# Patient Record
Sex: Male | Born: 1955 | Race: White | Hispanic: No | Marital: Married | Smoking: Former smoker
Health system: Southern US, Community
[De-identification: ages and names within clinical notes are randomized; demographics above are authoritative.]

## PROBLEM LIST (undated history)

## (undated) DIAGNOSIS — K219 Gastro-esophageal reflux disease without esophagitis: Secondary | ICD-10-CM

## (undated) DIAGNOSIS — E119 Type 2 diabetes mellitus without complications: Secondary | ICD-10-CM

## (undated) DIAGNOSIS — I10 Essential (primary) hypertension: Secondary | ICD-10-CM

## (undated) DIAGNOSIS — E785 Hyperlipidemia, unspecified: Secondary | ICD-10-CM

## (undated) DIAGNOSIS — I4719 Other supraventricular tachycardia: Secondary | ICD-10-CM

## (undated) DIAGNOSIS — I471 Supraventricular tachycardia: Secondary | ICD-10-CM

## (undated) DIAGNOSIS — K469 Unspecified abdominal hernia without obstruction or gangrene: Secondary | ICD-10-CM

## (undated) HISTORY — DX: Essential (primary) hypertension: I10

## (undated) HISTORY — DX: Supraventricular tachycardia: I47.1

## (undated) HISTORY — DX: Hyperlipidemia, unspecified: E78.5

## (undated) HISTORY — DX: Other supraventricular tachycardia: I47.19

## (undated) HISTORY — DX: Unspecified abdominal hernia without obstruction or gangrene: K46.9

---
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
## 1980-10-15 HISTORY — PX: OTHER SURGICAL HISTORY: SHX169

## 1987-10-16 HISTORY — PX: HERNIA REPAIR: SHX51

## 2006-11-14 ENCOUNTER — Ambulatory Visit (HOSPITAL_COMMUNITY): Admission: RE | Admit: 2006-11-14 | Discharge: 2006-11-14 | Payer: Self-pay | Admitting: Family Medicine

## 2008-03-29 ENCOUNTER — Encounter (HOSPITAL_COMMUNITY): Admission: RE | Admit: 2008-03-29 | Discharge: 2008-03-30 | Payer: Self-pay | Admitting: Family Medicine

## 2009-11-26 ENCOUNTER — Emergency Department (HOSPITAL_COMMUNITY): Admission: EM | Admit: 2009-11-26 | Discharge: 2009-11-26 | Payer: Self-pay | Admitting: Family Medicine

## 2010-12-04 ENCOUNTER — Other Ambulatory Visit: Payer: Self-pay | Admitting: Surgery

## 2010-12-04 ENCOUNTER — Encounter (HOSPITAL_COMMUNITY): Payer: PRIVATE HEALTH INSURANCE

## 2010-12-04 ENCOUNTER — Encounter (HOSPITAL_COMMUNITY)
Admission: RE | Admit: 2010-12-04 | Discharge: 2010-12-04 | Disposition: A | Discharge status: Home / Self Care | Payer: PRIVATE HEALTH INSURANCE | Source: Ambulatory Visit | Attending: Surgery | Admitting: Surgery

## 2010-12-04 ENCOUNTER — Other Ambulatory Visit (HOSPITAL_COMMUNITY): Payer: Self-pay | Admitting: Surgery

## 2010-12-04 DIAGNOSIS — K469 Unspecified abdominal hernia without obstruction or gangrene: Secondary | ICD-10-CM

## 2010-12-04 DIAGNOSIS — Z01812 Encounter for preprocedural laboratory examination: Secondary | ICD-10-CM | POA: Insufficient documentation

## 2010-12-04 DIAGNOSIS — Z01810 Encounter for preprocedural cardiovascular examination: Secondary | ICD-10-CM | POA: Insufficient documentation

## 2010-12-04 DIAGNOSIS — Z01818 Encounter for other preprocedural examination: Secondary | ICD-10-CM | POA: Insufficient documentation

## 2010-12-04 LAB — CBC
HCT: 42.4 % (ref 39.0–52.0)
Hemoglobin: 14 g/dL (ref 13.0–17.0)
MCHC: 33 g/dL (ref 30.0–36.0)
MCV: 88.7 fL (ref 78.0–100.0)
WBC: 5.2 10*3/uL (ref 4.0–10.5)

## 2010-12-04 LAB — COMPREHENSIVE METABOLIC PANEL
ALT: 28 U/L (ref 0–53)
Alkaline Phosphatase: 51 U/L (ref 39–117)
CO2: 28 mEq/L (ref 19–32)
Glucose, Bld: 107 mg/dL — ABNORMAL HIGH (ref 70–99)
Potassium: 3.8 mEq/L (ref 3.5–5.1)
Sodium: 139 mEq/L (ref 135–145)
Total Bilirubin: 0.5 mg/dL (ref 0.3–1.2)

## 2010-12-04 LAB — DIFFERENTIAL
Basophils Absolute: 0.1 10*3/uL (ref 0.0–0.1)
Lymphocytes Relative: 46 % (ref 12–46)
Lymphs Abs: 2.4 10*3/uL (ref 0.7–4.0)
Monocytes Absolute: 0.7 10*3/uL (ref 0.1–1.0)
Neutro Abs: 1.7 10*3/uL (ref 1.7–7.7)

## 2010-12-04 LAB — SURGICAL PCR SCREEN: MRSA, PCR: NEGATIVE

## 2010-12-05 ENCOUNTER — Ambulatory Visit (HOSPITAL_COMMUNITY)
Admission: RE | Admit: 2010-12-05 | Discharge: 2010-12-05 | Disposition: A | Discharge status: Home / Self Care | Payer: PRIVATE HEALTH INSURANCE | Source: Ambulatory Visit | Attending: Surgery | Admitting: Surgery

## 2010-12-05 DIAGNOSIS — I471 Supraventricular tachycardia, unspecified: Secondary | ICD-10-CM | POA: Insufficient documentation

## 2010-12-05 DIAGNOSIS — Z79899 Other long term (current) drug therapy: Secondary | ICD-10-CM | POA: Insufficient documentation

## 2010-12-05 DIAGNOSIS — K429 Umbilical hernia without obstruction or gangrene: Secondary | ICD-10-CM | POA: Insufficient documentation

## 2010-12-05 DIAGNOSIS — K4090 Unilateral inguinal hernia, without obstruction or gangrene, not specified as recurrent: Secondary | ICD-10-CM | POA: Insufficient documentation

## 2010-12-05 DIAGNOSIS — I10 Essential (primary) hypertension: Secondary | ICD-10-CM | POA: Insufficient documentation

## 2010-12-12 NOTE — Op Note (Signed)
  [name], Travis Hamilton               ACCOUNT NO.:  [id number]  MEDICAL RECORD NO.:  [id number]           PATIENT TYPE:  O  LOCATION:  XRAY                         FACILITY:  [facility]  PHYSICIAN:  Llewellyn Schoenberger A. Yavonne Kiss, M.D.DATE OF BIRTH:  [birth year]  DATE OF PROCEDURE:  12/05/2010 DATE OF DISCHARGE:  12/04/2010                              OPERATIVE REPORT   PREOPERATIVE DIAGNOSIS:  Umbilical hernia.  POSTOPERATIVE DIAGNOSIS:  Umbilical hernia.  PROCEDURE:  Repair of 2 cm umbilical hernia with PROCEED mesh.  SURGEON:  Maisie Fus A. Chistopher Mangino, M.D.  ANESTHESIA:  General endotracheal anesthesia with 0.25% Sensorcaine local.  ESTIMATED BLOOD LOSS:  Minimal.  SPECIMEN:  None.  INDICATIONS FOR PROCEDURE:  The patient is a 55 year old male with a symptomatic umbilical hernia that is getting larger.  We discussed the options of repair versus observation.  He wished to proceed with repair after discussion of the procedure, the risks, potential long-term benefits and quality of life issues.  DESCRIPTION OF PROCEDURE:  The patient was brought to the operating room and placed supine.  The abdomen was then prepped and draped in the sterile fashion after induction of general anesthesia without difficulty.  Time-out was done.  After prep and drape, a curvilinear incision was made along the inferior aspect of the umbilicus.  The skin was elevated off the hernia sac.  The hernia sac was dissected off the undersurface of the skin.  The hernia defect was identified.  It was grasped with Kochers.  It was about 2 cm.  I opened the hernia sac and there were no contents within it.  I went ahead and closed the hernia sac with 3-0 Vicryl.  I then created a space in the preperitoneal space using my finger bluntly for about 2 to 3 cm circumferentially.  A 4.3 x 4.3 cm PROCEED umbilical mesh was brought onto the field.  It was placed in the preperitoneal space.  The straps were pulled up on the mesh to help  seed it under the undersurface of the abdominal wall.  It was secured in place with six sutures of #1 Novafil pop-off.  We then closed the common fascial defect with #1 Novafil pop-off.  We then sutured the undersurface of the umbilicus down to the fascia.  We closed the skin with 4-0 Monocryl.  Dermabond was applied.  All final counts of sponge, needle and instruments were found to be correct at this portion of the patient's case.  The patient was awoken and taken to recovery in satisfactory condition.     Vedder Brittian A. Zethan Alfieri, M.D.     TAC/MEDQ  D:  12/05/2010  T:  12/05/2010  Job:  [id number]  cc:   Bryan Lemma. Manus Gunning, M.D. Fax: [phone]  Electronically Signed by Harriette Bouillon M.D. on 12/12/2010 08:45:17 AM

## 2011-03-22 ENCOUNTER — Encounter (INDEPENDENT_AMBULATORY_CARE_PROVIDER_SITE_OTHER): Payer: Self-pay | Admitting: Surgery

## 2013-12-16 ENCOUNTER — Ambulatory Visit: Payer: PRIVATE HEALTH INSURANCE | Admitting: Dietician

## 2014-01-27 ENCOUNTER — Ambulatory Visit: Payer: PRIVATE HEALTH INSURANCE | Admitting: *Deleted

## 2014-12-16 ENCOUNTER — Ambulatory Visit: Payer: PRIVATE HEALTH INSURANCE

## 2014-12-23 ENCOUNTER — Ambulatory Visit: Payer: PRIVATE HEALTH INSURANCE

## 2014-12-30 ENCOUNTER — Ambulatory Visit: Payer: PRIVATE HEALTH INSURANCE

## 2015-10-19 ENCOUNTER — Encounter (HOSPITAL_COMMUNITY): Payer: Self-pay | Admitting: *Deleted

## 2015-10-19 ENCOUNTER — Emergency Department (HOSPITAL_COMMUNITY): Payer: PRIVATE HEALTH INSURANCE

## 2015-10-19 ENCOUNTER — Emergency Department (HOSPITAL_COMMUNITY)
Admission: EM | Admit: 2015-10-19 | Discharge: 2015-10-19 | Disposition: A | Discharge status: Home / Self Care | Payer: PRIVATE HEALTH INSURANCE | Attending: Emergency Medicine | Admitting: Emergency Medicine

## 2015-10-19 DIAGNOSIS — Z87891 Personal history of nicotine dependence: Secondary | ICD-10-CM | POA: Diagnosis not present

## 2015-10-19 DIAGNOSIS — Z8719 Personal history of other diseases of the digestive system: Secondary | ICD-10-CM | POA: Insufficient documentation

## 2015-10-19 DIAGNOSIS — R000 Tachycardia, unspecified: Secondary | ICD-10-CM | POA: Diagnosis not present

## 2015-10-19 DIAGNOSIS — Z79899 Other long term (current) drug therapy: Secondary | ICD-10-CM | POA: Diagnosis not present

## 2015-10-19 DIAGNOSIS — E785 Hyperlipidemia, unspecified: Secondary | ICD-10-CM | POA: Insufficient documentation

## 2015-10-19 DIAGNOSIS — Z791 Long term (current) use of non-steroidal anti-inflammatories (NSAID): Secondary | ICD-10-CM | POA: Diagnosis not present

## 2015-10-19 DIAGNOSIS — R002 Palpitations: Secondary | ICD-10-CM | POA: Diagnosis present

## 2015-10-19 DIAGNOSIS — R079 Chest pain, unspecified: Secondary | ICD-10-CM | POA: Insufficient documentation

## 2015-10-19 DIAGNOSIS — I10 Essential (primary) hypertension: Secondary | ICD-10-CM | POA: Insufficient documentation

## 2015-10-19 LAB — CBC WITH DIFFERENTIAL/PLATELET
BASOS ABS: 0 10*3/uL (ref 0.0–0.1)
Basophils Relative: 0 %
Eosinophils Absolute: 0 10*3/uL (ref 0.0–0.7)
Eosinophils Relative: 0 %
HEMATOCRIT: 42.8 % (ref 39.0–52.0)
Hemoglobin: 14.4 g/dL (ref 13.0–17.0)
LYMPHS PCT: 18 %
Lymphs Abs: 1.6 10*3/uL (ref 0.7–4.0)
MCH: 28.8 pg (ref 26.0–34.0)
MCHC: 33.6 g/dL (ref 30.0–36.0)
MCV: 85.6 fL (ref 78.0–100.0)
Monocytes Absolute: 0.1 10*3/uL (ref 0.1–1.0)
Monocytes Relative: 2 %
NEUTROS ABS: 7.2 10*3/uL (ref 1.7–7.7)
Neutrophils Relative %: 80 %
PLATELETS: 268 10*3/uL (ref 150–400)
RBC: 5 MIL/uL (ref 4.22–5.81)
RDW: 12.7 % (ref 11.5–15.5)
WBC: 8.8 10*3/uL (ref 4.0–10.5)

## 2015-10-19 LAB — COMPREHENSIVE METABOLIC PANEL
ALT: 25 U/L (ref 17–63)
AST: 25 U/L (ref 15–41)
Albumin: 3.6 g/dL (ref 3.5–5.0)
Alkaline Phosphatase: 48 U/L (ref 38–126)
Anion gap: 11 (ref 5–15)
BILIRUBIN TOTAL: 0.6 mg/dL (ref 0.3–1.2)
BUN: 14 mg/dL (ref 6–20)
CHLORIDE: 103 mmol/L (ref 101–111)
CO2: 22 mmol/L (ref 22–32)
CREATININE: 1.29 mg/dL — AB (ref 0.61–1.24)
Calcium: 9.8 mg/dL (ref 8.9–10.3)
GFR calc Af Amer: >60 mL/min (ref >60)
GFR, EST NON AFRICAN AMERICAN: 58 mL/min — AB (ref >60)
GLUCOSE: 326 mg/dL — AB (ref 65–99)
Potassium: 4.2 mmol/L (ref 3.5–5.1)
Sodium: 136 mmol/L (ref 135–145)
Total Protein: 7.3 g/dL (ref 6.5–8.1)

## 2015-10-19 LAB — I-STAT TROPONIN, ED: TROPONIN I, POC: 0.02 ng/mL (ref 0.00–0.08)

## 2015-10-19 MED ORDER — LORAZEPAM 2 MG/ML IJ SOLN
0.5000 mg | Freq: Once | INTRAMUSCULAR | Status: AC
  Administered 2015-10-19: 0.5 mg via INTRAVENOUS
  Filled 2015-10-19: qty 1

## 2015-10-19 MED ORDER — IOHEXOL 350 MG/ML SOLN
60.0000 mL | Freq: Once | INTRAVENOUS | Status: AC | PRN
  Administered 2015-10-19: 60 mL via INTRAVENOUS

## 2015-10-19 MED ORDER — SODIUM CHLORIDE 0.9 % IV BOLUS (SEPSIS)
1000.0000 mL | Freq: Once | INTRAVENOUS | Status: AC
  Administered 2015-10-19: 1000 mL via INTRAVENOUS

## 2015-10-19 NOTE — ED Notes (Signed)
Dr. Floyd at bedside. 

## 2015-10-19 NOTE — Discharge Instructions (Signed)
Nonspecific Tachycardia Tachycardia is a faster than normal heartbeat (more than 100 beats per minute). In adults, the heart normally beats between 60 and 100 times a minute. A fast heartbeat may be a normal response to exercise or stress. It does not necessarily mean that something is wrong. However, sometimes when your heart beats too fast it may not be able to pump enough blood to the rest of your body. This can result in chest pain, shortness of breath, dizziness, and even fainting. Nonspecific tachycardia means that the specific cause or pattern of your tachycardia is unknown. CAUSES  Tachycardia may be harmless or it may be due to a more serious underlying cause. Possible causes of tachycardia include:  Exercise or exertion.  Fever.  Pain or injury.  Infection.  Loss of body fluids (dehydration).  Overactive thyroid.  Lack of red blood cells (anemia).  Anxiety and stress.  Alcohol.  Caffeine.  Tobacco products.  Diet pills.  Illegal drugs.  Heart disease. SYMPTOMS  Rapid or irregular heartbeat (palpitations).  Suddenly feeling your heart beating (cardiac awareness).  Dizziness.  Tiredness (fatigue).  Shortness of breath.  Chest pain.  Nausea.  Fainting. DIAGNOSIS  Your caregiver will perform a physical exam and take your medical history. In some cases, a heart specialist (cardiologist) may be consulted. Your caregiver may also order:  Blood tests.  Electrocardiography. This test records the electrical activity of your heart.  A heart monitoring test. TREATMENT  Treatment will depend on the likely cause of your tachycardia. The goal is to treat the underlying cause of your tachycardia. Treatment methods may include:  Replacement of fluids or blood through an intravenous (IV) tube for moderate to severe dehydration or anemia.  New medicines or changes in your current medicines.  Diet and lifestyle changes.  Treatment for certain  infections.  Stress relief or relaxation methods. HOME CARE INSTRUCTIONS   Rest.  Drink enough fluids to keep your urine clear or pale yellow.  Do not smoke.  Avoid:  Caffeine.  Tobacco.  Alcohol.  Chocolate.  Stimulants such as over-the-counter diet pills or pills that help you stay awake.  Situations that cause anxiety or stress.  Illegal drugs such as marijuana, phencyclidine (PCP), and cocaine.  Only take medicine as directed by your caregiver.  Keep all follow-up appointments as directed by your caregiver. SEEK IMMEDIATE MEDICAL CARE IF:   You have pain in your chest, upper arms, jaw, or neck.  You become weak, dizzy, or feel faint.  You have palpitations that will not go away.  You vomit, have diarrhea, or pass blood in your stool.  Your skin is cool, pale, and wet.  You have a fever that will not go away with rest, fluids, and medicine. MAKE SURE YOU:   Understand these instructions.  Will watch your condition.  Will get help right away if you are not doing well or get worse.   This information is not intended to replace advice given to you by your health care provider. Make sure you discuss any questions you have with your health care provider.   Document Released: 11/08/2004 Document Revised: 12/24/2011 Document Reviewed: 04/15/2015 Elsevier Interactive Patient Education 2016 [employer]  

## 2015-10-19 NOTE — ED Notes (Signed)
Seen today at PMD and dx with bronchitis and was given a breathing treat and a shot (thinks it may have been a steriod).  Tonight he started with feeling like his heart was beating out of his chest and it just continued to get worse

## 2015-10-19 NOTE — ED Provider Notes (Signed)
CSN: [id number]     Arrival date & time 10/19/15  9147 History   First MD Initiated Contact with Patient 10/19/15 0405     Chief Complaint  Patient presents with  . Tachycardia     (Consider location/radiation/quality/duration/timing/severity/associated sxs/prior Treatment) Patient is a 60 y.o. male presenting with palpitations. The history is provided by the patient.  Palpitations Palpitations quality:  Fast Onset quality:  Sudden Duration:  6 hours Timing:  Constant Progression:  Partially resolved Chronicity:  Recurrent Context: anxiety and caffeine   Relieved by:  Nothing Worsened by:  Nothing Ineffective treatments:  None tried Associated symptoms: chest pain   Associated symptoms: no shortness of breath and no vomiting    60 yo M with a chief complaint of palpitations. Patient was seen today by his family doctor for an upper respiratory illness. They told him that he probably had pneumonia/bronchitis gave him a breathing treatment in the office, as well as a IM steroid injection. Patient said about 2 hours later he had palpitations. Mild chest pressure with this. Worse on exertion, denies difficulty breathing diaphoresis nausea. Patient has a history of paroxysmal atrial tachycardia. Has not had any episodes of this over the past 20 years or so. Patient is a heavy caffeine user. Denied any other new medications. Denies illegal drug use.   Past Medical History  Diagnosis Date  . Hypertension   . PAT (paroxysmal atrial tachycardia) (HCC)   . Hernia   . Hyperlipidemia    Past Surgical History  Procedure Laterality Date  . Tonsillectomy  1960  . Partial disc removal  1982    L-5  . Hernia repair  1989    LOWER LEFT SIDE   Family History  Problem Relation Age of Onset  . Arthritis Sister    Social History  Substance Use Topics  . Smoking status: Former Smoker    Quit date: 02/13/2007  . Smokeless tobacco: Never Used  . Alcohol Use: Yes     Comment: ocassionally     Review of Systems  Constitutional: Negative for fever and chills.  HENT: Negative for congestion and facial swelling.   Eyes: Negative for discharge and visual disturbance.  Respiratory: Negative for shortness of breath.   Cardiovascular: Positive for chest pain and palpitations.  Gastrointestinal: Negative for vomiting, abdominal pain and diarrhea.  Musculoskeletal: Negative for myalgias and arthralgias.  Skin: Negative for color change and rash.  Neurological: Negative for tremors, syncope and headaches.  Psychiatric/Behavioral: Negative for confusion and dysphoric mood.      Allergies  Review of patient's allergies indicates no known allergies.  Home Medications   Prior to Admission medications   Medication Sig Start Date End Date Taking? Authorizing Provider  aspirin-acetaminophen-caffeine (EXCEDRIN MIGRAINE) [phone] MG tablet Take 1 tablet by mouth daily with breakfast.   Yes Historical Provider, MD  lisinopril (PRINIVIL,ZESTRIL) 20 MG tablet Take 20 mg by mouth daily.     Yes Historical Provider, MD  lovastatin (MEVACOR) 40 MG tablet Take 40 mg by mouth at bedtime.     Yes Historical Provider, MD  naproxen sodium (ANAPROX) 220 MG tablet Take 220 mg by mouth 2 (two) times daily with a meal.     Yes Historical Provider, MD   BP 118/66 mmHg  Pulse 117  Temp(Src) 98.6 F (37 C) (Oral)  Resp 23  Ht 5\' 8"  (1.727 m)  Wt 223 lb (101.152 kg)  BMI 33.91 kg/m2  SpO2 94% Physical Exam  Constitutional: He is oriented to person,  place, and time. He appears well-developed and well-nourished.  HENT:  Head: Normocephalic and atraumatic.  Eyes: EOM are normal. Pupils are equal, round, and reactive to light.  Neck: Normal range of motion. Neck supple. No JVD present.  Cardiovascular: Regular rhythm.  Tachycardia present.  Exam reveals no gallop and no friction rub.   No murmur heard. Pulmonary/Chest: No respiratory distress. He has no wheezes.  Abdominal: He exhibits no  distension. There is no tenderness. There is no rebound and no guarding.  Musculoskeletal: Normal range of motion.  Neurological: He is alert and oriented to person, place, and time.  Skin: No rash noted. No pallor.  Psychiatric: He has a normal mood and affect. His behavior is normal.  Nursing note and vitals reviewed.   ED Course  Procedures (including critical care time) Labs Review Labs Reviewed  COMPREHENSIVE METABOLIC PANEL - Abnormal; Notable for the following:    Glucose, Bld 326 (*)    Creatinine, Ser 1.29 (*)    GFR calc non Af Amer 58 (*)    All other components within normal limits  CBC WITH DIFFERENTIAL/PLATELET  Rosezena Sensor, ED    Imaging Review Dg Chest 2 View  10/19/2015  CLINICAL DATA:  Acute onset of shortness of breath, generalized chest pain and palpitations. Initial encounter. EXAM: CHEST  2 VIEW COMPARISON:  Chest radiograph performed 12/04/2010 FINDINGS: The lungs are well-aerated. Mild vascular congestion is noted. There is no evidence of focal opacification, pleural effusion or pneumothorax. The heart is normal in size; the mediastinal contour is within normal limits. No acute osseous abnormalities are seen. IMPRESSION: No acute cardiopulmonary process seen. Mild vascular congestion noted. Electronically Signed   By: Roanna Raider M.D.   On: 10/19/2015 04:04   Ct Angio Chest Pe W/cm &/or Wo Cm  10/19/2015  CLINICAL DATA:  Acute onset of tachycardia and shortness of breath. Initial encounter. EXAM: CT ANGIOGRAPHY CHEST WITH CONTRAST TECHNIQUE: Multidetector CT imaging of the chest was performed using the standard protocol during bolus administration of intravenous contrast. Multiplanar CT image reconstructions and MIPs were obtained to evaluate the vascular anatomy. CONTRAST:  60mL OMNIPAQUE IOHEXOL 350 MG/ML SOLN COMPARISON:  Chest radiograph performed earlier today at 3:48 a.m. FINDINGS: There is no evidence of pulmonary embolus. Vague lucency at the lung  apices is thought to reflect very mild emphysematous change. There is no evidence of significant focal consolidation, pleural effusion or pneumothorax. No masses are identified; no abnormal focal contrast enhancement is seen. The mediastinum is unremarkable in appearance. No mediastinal lymphadenopathy is seen. No pericardial effusion is identified. The great vessels are grossly unremarkable in appearance. No axillary lymphadenopathy is seen. The thyroid gland is unremarkable in appearance. The visualized portions of the liver and spleen are unremarkable. The gallbladder is contracted and grossly unremarkable. The visualized portions of the pancreas, adrenal glands and kidneys are grossly unremarkable. No acute osseous abnormalities are seen. Review of the MIP images confirms the above findings. IMPRESSION: 1. No evidence of pulmonary embolus. 2. Vague lucency at the lung apices is thought to reflect very mild emphysematous change. Lungs otherwise clear. Electronically Signed   By: Roanna Raider M.D.   On: 10/19/2015 06:21   I have personally reviewed and evaluated these images and lab results as part of my medical decision-making.   EKG Interpretation   Date/Time:  Wednesday October 19 2015 03:45:57 EST Ventricular Rate:  133 PR Interval:  130 QRS Duration: 78 QT Interval:  318 QTC Calculation: 473 R Axis:  92 Text Interpretation:  Sinus tachycardia Rightward axis Nonspecific ST  abnormality Abnormal ECG Otherwise no significant change Confirmed by  Mei Suits MD, DANIEL ([id number]) on 10/19/2015 4:30:40 AM      MDM   Final diagnoses:  Sinus tachycardia (HCC)    60 yo M with a chief complaint of palpitations. Patient initial heart rate was in the 130s sinus tachycardia on EKG. On my initial exam patient's heart rate was in the low 100s but jumped up when he started talking to me. Appeared anxious. Patient was given a liter of fluids and Ativan with very minimal relief. Patient not having any  current symptoms of palpitations. Pulmonary embolism study was negative. Patient continues to be mildly tachycardic. States that his heart rate normally is in the low 100s to 110s. He would like to be discharged at this time. We'll have him follow-up with his family doctor and possibly need cardiology follow-up.  6:53 AM:  I have discussed the diagnosis/risks/treatment options with the patient and family and believe the pt to be eligible for discharge home to follow-up with PCP. We also discussed returning to the ED immediately if new or worsening sx occur. We discussed the sx which are most concerning (e.g., sudden worsening chest pain, sob,  fever, inability to tolerate by mouth) that necessitate immediate return. Medications administered to the patient during their visit and any new prescriptions provided to the patient are listed below.  Medications given during this visit Medications  sodium chloride 0.9 % bolus 1,000 mL (0 mLs Intravenous Stopped 10/19/15 0641)  LORazepam (ATIVAN) injection 0.5 mg (0.5 mg Intravenous Given 10/19/15 0529)  iohexol (OMNIPAQUE) 350 MG/ML injection 60 mL (60 mLs Intravenous Contrast Given 10/19/15 0555)    Discharge Medication List as of 10/19/2015  6:25 AM      The patient appears reasonably screen and/or stabilized for discharge and I doubt any other medical condition or other [facility] requiring further screening, evaluation, or treatment in the ED at this time prior to discharge.       Melene Planan Alvie Fowles, DO 10/19/15 [phone]

## 2017-03-17 IMAGING — DX DG CHEST 2V
2 series · 2 of 2 positions shown · non-contrast
Comparison: Chest radiograph performed 12/04/2010

CLINICAL DATA: Acute onset of shortness of breath, generalized
chest pain and palpitations. Initial encounter.

EXAM:
CHEST  2 VIEW

[chest pa]
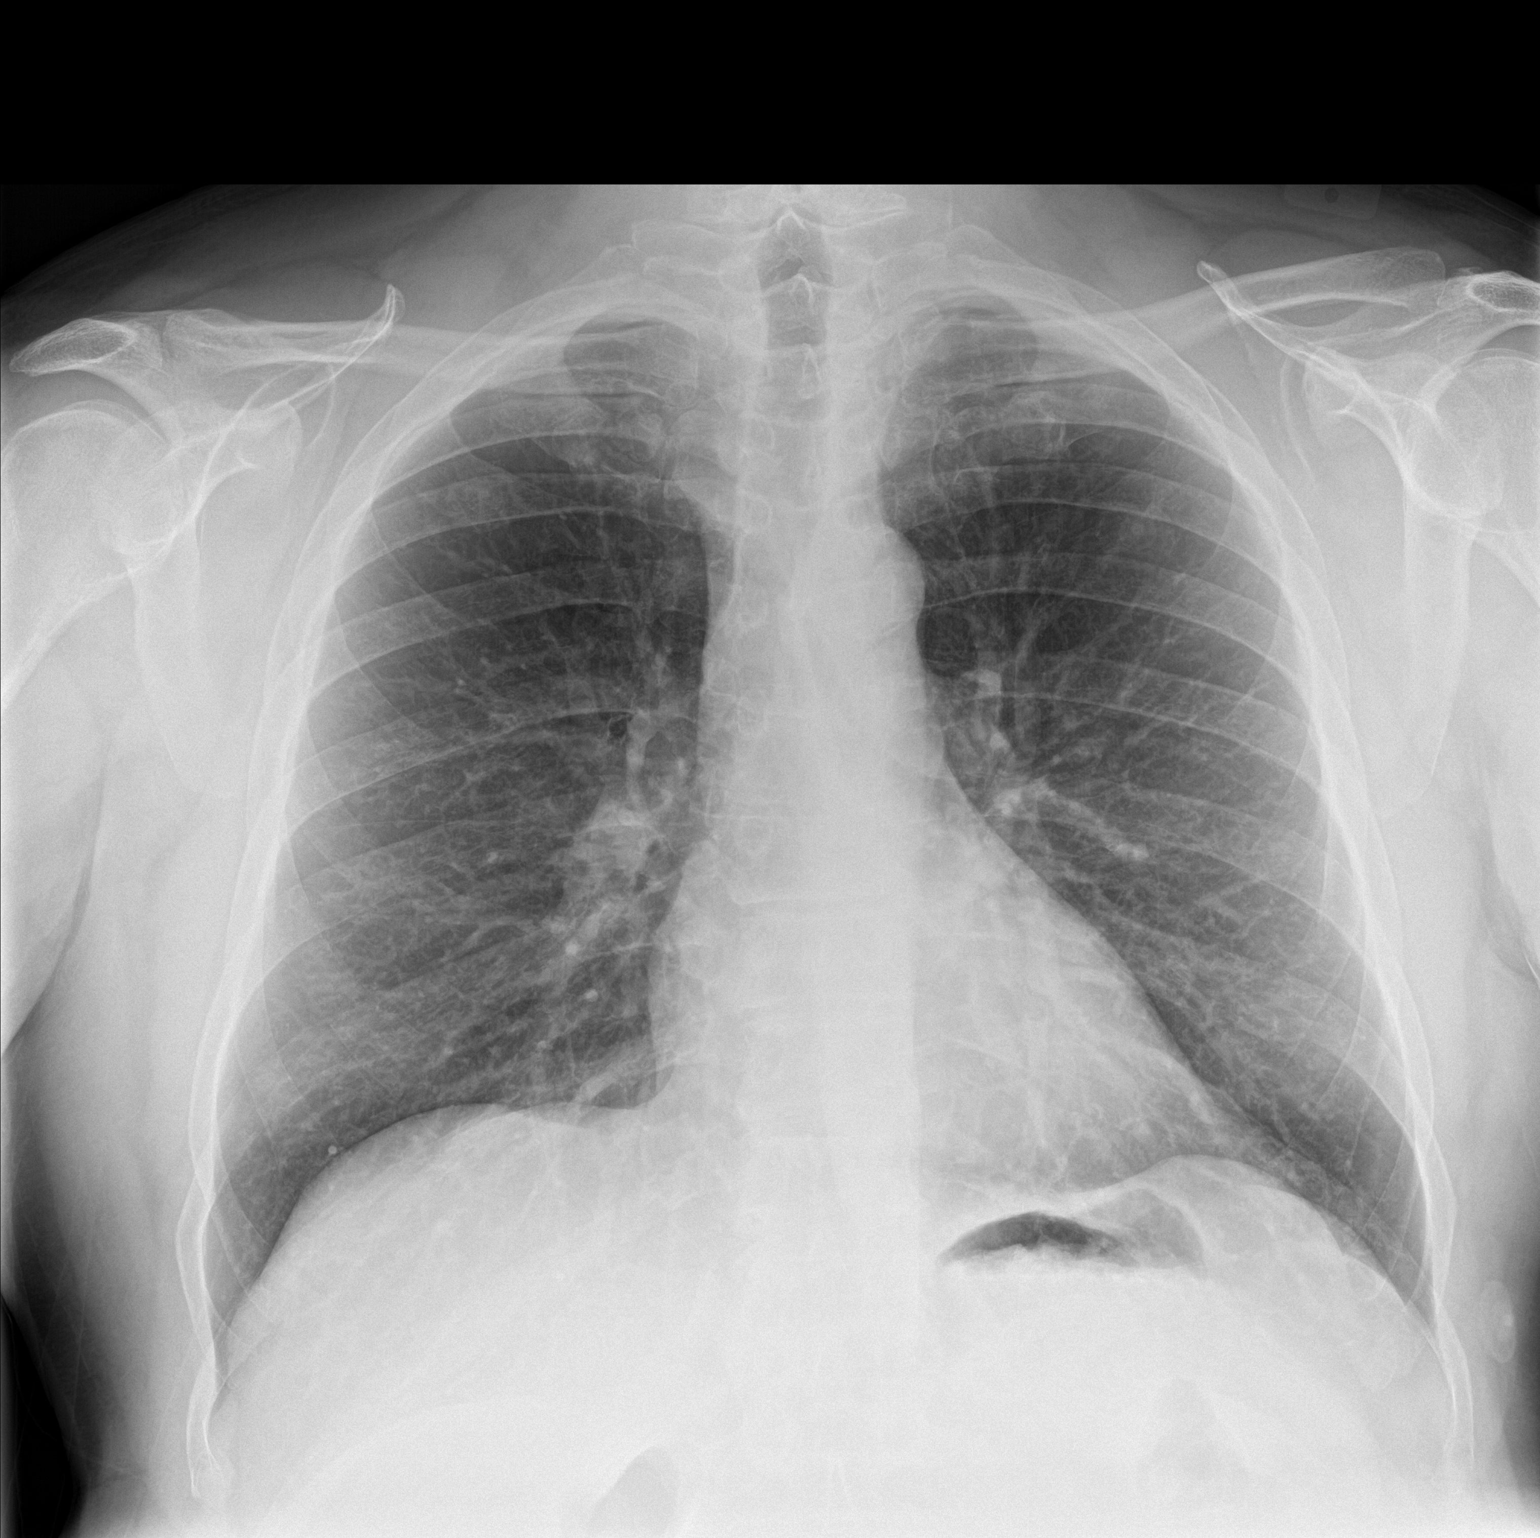

[chest lat]
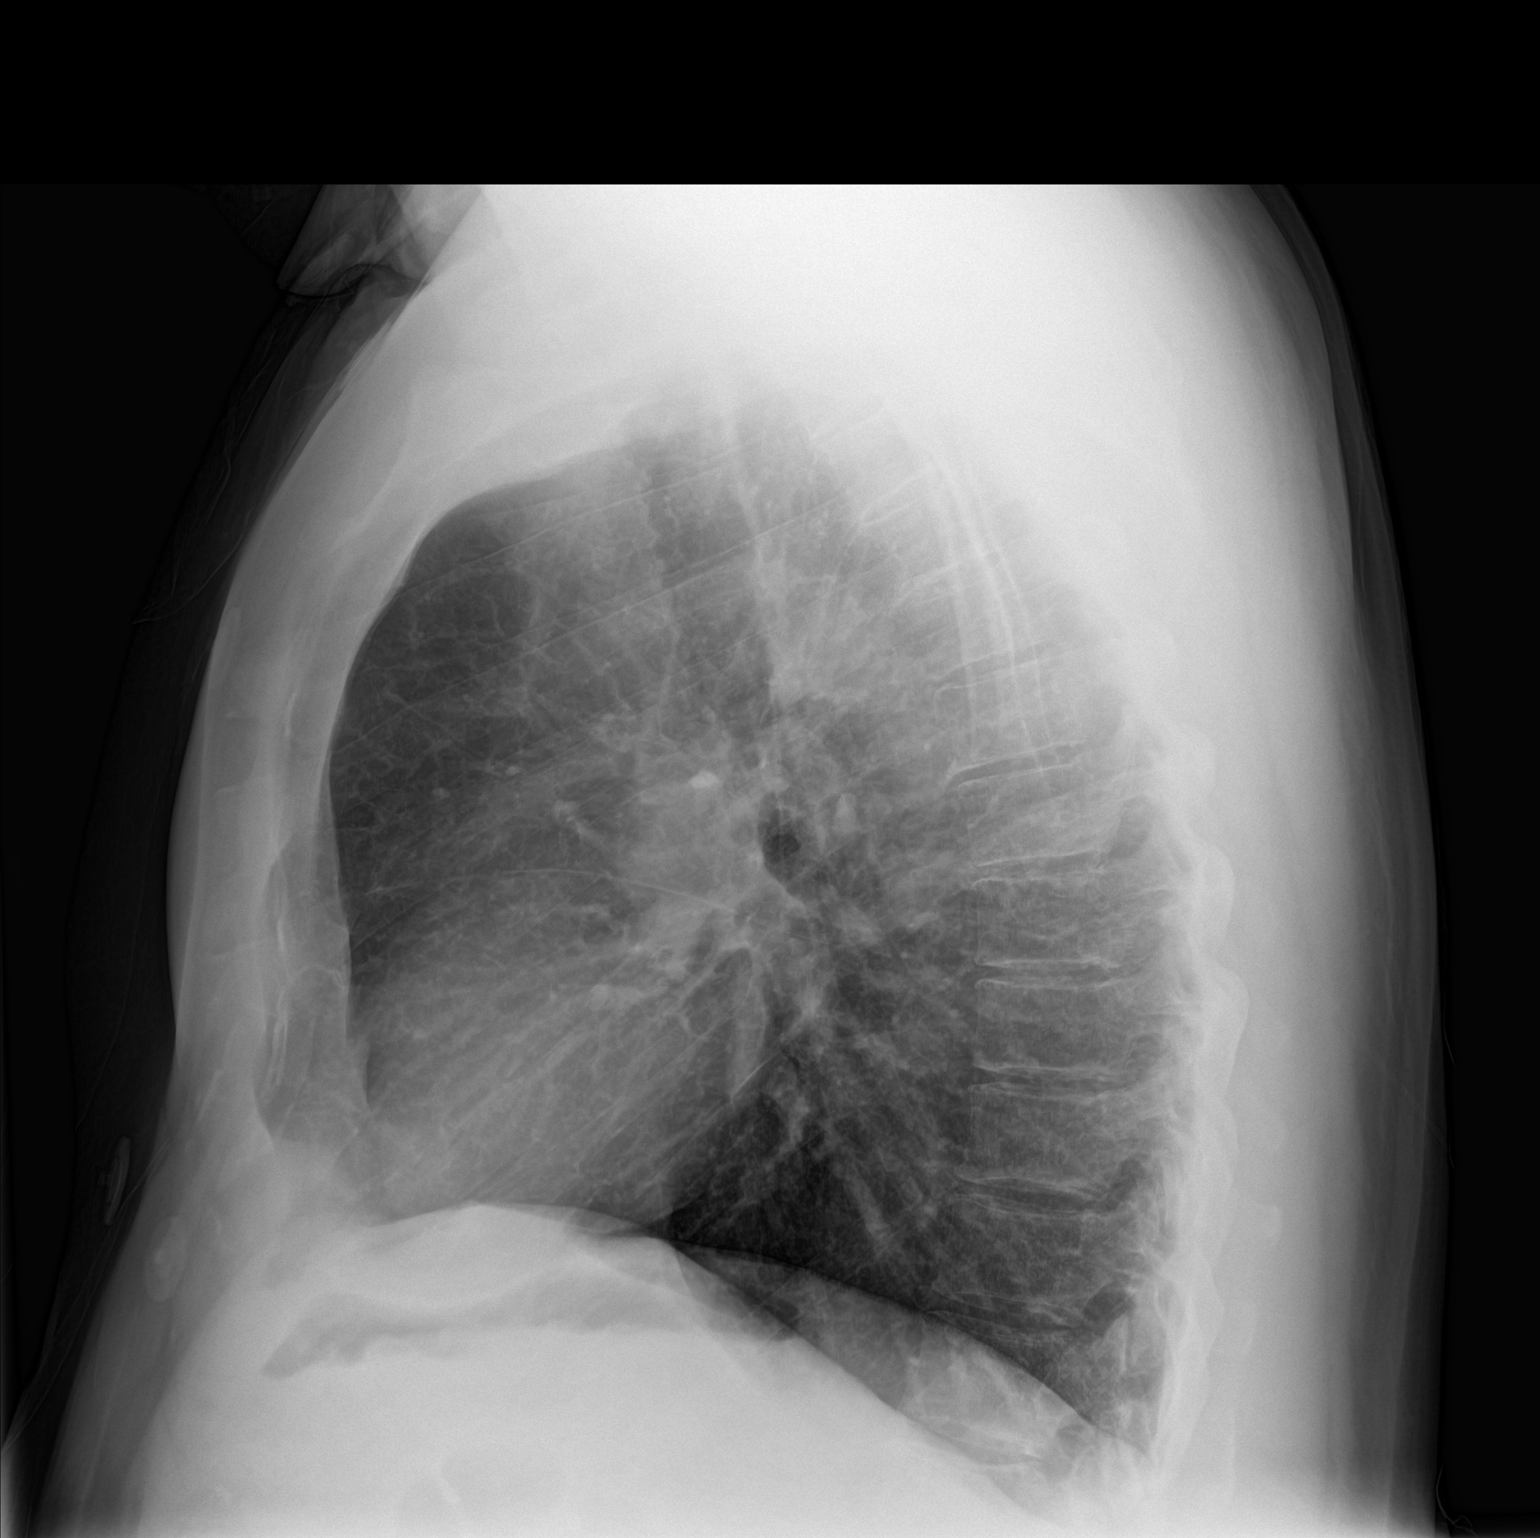

[2 of 2 positions shown; findings below may reference images not displayed]

FINDINGS: The lungs are well-aerated. Mild vascular congestion is noted. There
is no evidence of focal opacification, pleural effusion or
pneumothorax.

The heart is normal in size; the mediastinal contour is within
normal limits. No acute osseous abnormalities are seen.
IMPRESSION: No acute cardiopulmonary process seen. Mild vascular congestion
noted.

## 2017-04-25 ENCOUNTER — Other Ambulatory Visit: Payer: Self-pay | Admitting: Family Medicine

## 2017-04-25 DIAGNOSIS — R1310 Dysphagia, unspecified: Secondary | ICD-10-CM

## 2017-04-26 ENCOUNTER — Other Ambulatory Visit: Payer: PRIVATE HEALTH INSURANCE

## 2017-05-01 ENCOUNTER — Other Ambulatory Visit: Payer: PRIVATE HEALTH INSURANCE

## 2017-10-01 ENCOUNTER — Other Ambulatory Visit: Payer: Self-pay | Admitting: Orthopedic Surgery

## 2017-11-06 ENCOUNTER — Other Ambulatory Visit: Payer: Self-pay

## 2017-11-06 ENCOUNTER — Encounter (HOSPITAL_BASED_OUTPATIENT_CLINIC_OR_DEPARTMENT_OTHER): Payer: Self-pay | Admitting: *Deleted

## 2017-11-07 ENCOUNTER — Encounter (HOSPITAL_BASED_OUTPATIENT_CLINIC_OR_DEPARTMENT_OTHER)
Admission: RE | Admit: 2017-11-07 | Discharge: 2017-11-07 | Disposition: A | Discharge status: Home / Self Care | Payer: 59 | Source: Ambulatory Visit | Attending: Orthopedic Surgery | Admitting: Orthopedic Surgery

## 2017-11-07 DIAGNOSIS — E119 Type 2 diabetes mellitus without complications: Secondary | ICD-10-CM | POA: Diagnosis not present

## 2017-11-07 DIAGNOSIS — Z01812 Encounter for preprocedural laboratory examination: Secondary | ICD-10-CM | POA: Diagnosis present

## 2017-11-07 DIAGNOSIS — R9431 Abnormal electrocardiogram [ECG] [EKG]: Secondary | ICD-10-CM | POA: Insufficient documentation

## 2017-11-07 DIAGNOSIS — I10 Essential (primary) hypertension: Secondary | ICD-10-CM | POA: Diagnosis not present

## 2017-11-07 DIAGNOSIS — Z01810 Encounter for preprocedural cardiovascular examination: Secondary | ICD-10-CM | POA: Diagnosis present

## 2017-11-07 LAB — BASIC METABOLIC PANEL
ANION GAP: 10 (ref 5–15)
BUN: 11 mg/dL (ref 6–20)
CHLORIDE: 102 mmol/L (ref 101–111)
CO2: 24 mmol/L (ref 22–32)
Calcium: 9.1 mg/dL (ref 8.9–10.3)
Creatinine, Ser: 0.99 mg/dL (ref 0.61–1.24)
GFR calc Af Amer: >60 mL/min (ref >60)
GLUCOSE: 165 mg/dL — AB (ref 65–99)
Potassium: 4.6 mmol/L (ref 3.5–5.1)
SODIUM: 136 mmol/L (ref 135–145)

## 2017-11-07 NOTE — Progress Notes (Signed)
EKG reviewed by Dr. Turk, will proceed with surgery as scheduled. 

## 2017-11-08 ENCOUNTER — Other Ambulatory Visit: Payer: Self-pay | Admitting: Orthopedic Surgery

## 2017-11-12 ENCOUNTER — Ambulatory Visit (HOSPITAL_BASED_OUTPATIENT_CLINIC_OR_DEPARTMENT_OTHER): Payer: 59 | Admitting: Certified Registered"

## 2017-11-12 ENCOUNTER — Encounter (HOSPITAL_BASED_OUTPATIENT_CLINIC_OR_DEPARTMENT_OTHER): Payer: Self-pay | Admitting: *Deleted

## 2017-11-12 ENCOUNTER — Encounter (HOSPITAL_BASED_OUTPATIENT_CLINIC_OR_DEPARTMENT_OTHER): Payer: Self-pay

## 2017-11-12 ENCOUNTER — Encounter (HOSPITAL_BASED_OUTPATIENT_CLINIC_OR_DEPARTMENT_OTHER)
Admission: RE | Disposition: A | Discharge status: Home / Self Care | Payer: Self-pay | Source: Ambulatory Visit | Attending: Orthopedic Surgery

## 2017-11-12 ENCOUNTER — Ambulatory Visit (HOSPITAL_BASED_OUTPATIENT_CLINIC_OR_DEPARTMENT_OTHER): Admit: 2017-11-12 | Payer: PRIVATE HEALTH INSURANCE | Admitting: Orthopedic Surgery

## 2017-11-12 ENCOUNTER — Ambulatory Visit (HOSPITAL_BASED_OUTPATIENT_CLINIC_OR_DEPARTMENT_OTHER)
Admission: RE | Admit: 2017-11-12 | Discharge: 2017-11-12 | Disposition: A | Discharge status: Home / Self Care | Payer: 59 | Source: Ambulatory Visit | Attending: Orthopedic Surgery | Admitting: Orthopedic Surgery

## 2017-11-12 DIAGNOSIS — K219 Gastro-esophageal reflux disease without esophagitis: Secondary | ICD-10-CM | POA: Insufficient documentation

## 2017-11-12 DIAGNOSIS — E785 Hyperlipidemia, unspecified: Secondary | ICD-10-CM | POA: Insufficient documentation

## 2017-11-12 DIAGNOSIS — I10 Essential (primary) hypertension: Secondary | ICD-10-CM | POA: Insufficient documentation

## 2017-11-12 DIAGNOSIS — E119 Type 2 diabetes mellitus without complications: Secondary | ICD-10-CM | POA: Insufficient documentation

## 2017-11-12 DIAGNOSIS — Z87891 Personal history of nicotine dependence: Secondary | ICD-10-CM | POA: Diagnosis not present

## 2017-11-12 DIAGNOSIS — G5601 Carpal tunnel syndrome, right upper limb: Secondary | ICD-10-CM | POA: Insufficient documentation

## 2017-11-12 HISTORY — DX: Gastro-esophageal reflux disease without esophagitis: K21.9

## 2017-11-12 HISTORY — PX: CARPAL TUNNEL RELEASE: SHX101

## 2017-11-12 HISTORY — DX: Type 2 diabetes mellitus without complications: E11.9

## 2017-11-12 LAB — GLUCOSE, CAPILLARY: GLUCOSE-CAPILLARY: 133 mg/dL — AB (ref 65–99)

## 2017-11-12 SURGERY — CARPAL TUNNEL RELEASE
Anesthesia: Regional | Laterality: Right

## 2017-11-12 SURGERY — CARPAL TUNNEL RELEASE
Anesthesia: Monitor Anesthesia Care | Site: Wrist | Laterality: Right

## 2017-11-12 MED ORDER — FENTANYL CITRATE (PF) 100 MCG/2ML IJ SOLN
INTRAMUSCULAR | Status: AC
  Filled 2017-11-12: qty 2

## 2017-11-12 MED ORDER — BUPIVACAINE HCL (PF) 0.25 % IJ SOLN
INTRAMUSCULAR | Status: DC | PRN
  Administered 2017-11-12: 10 mL

## 2017-11-12 MED ORDER — ONDANSETRON HCL 4 MG/2ML IJ SOLN
INTRAMUSCULAR | Status: DC | PRN
  Administered 2017-11-12: 4 mg via INTRAVENOUS

## 2017-11-12 MED ORDER — BUPIVACAINE HCL (PF) 0.25 % IJ SOLN
INTRAMUSCULAR | Status: AC
  Filled 2017-11-12: qty 150

## 2017-11-12 MED ORDER — CEFAZOLIN SODIUM-DEXTROSE 2-4 GM/100ML-% IV SOLN
2.0000 g | INTRAVENOUS | Status: AC
  Administered 2017-11-12: 2 g via INTRAVENOUS

## 2017-11-12 MED ORDER — HYDROCODONE-ACETAMINOPHEN 5-325 MG PO TABS: 1.0000 | ORAL_TABLET | Freq: Four times a day (QID) | ORAL | 0 refills | Status: DC | PRN

## 2017-11-12 MED ORDER — MIDAZOLAM HCL 2 MG/2ML IJ SOLN
1.0000 mg | INTRAMUSCULAR | Status: DC | PRN
  Administered 2017-11-12: 2 mg via INTRAVENOUS

## 2017-11-12 MED ORDER — LIDOCAINE HCL (CARDIAC) 20 MG/ML IV SOLN
INTRAVENOUS | Status: DC | PRN
  Administered 2017-11-12: 30 mg via INTRAVENOUS

## 2017-11-12 MED ORDER — THROMBIN 5000 UNITS EX SOLR
CUTANEOUS | Status: AC
  Filled 2017-11-12: qty 5000

## 2017-11-12 MED ORDER — MIDAZOLAM HCL 2 MG/2ML IJ SOLN
INTRAMUSCULAR | Status: AC
  Filled 2017-11-12: qty 2

## 2017-11-12 MED ORDER — PROMETHAZINE HCL 25 MG/ML IJ SOLN: 6.2500 mg | INTRAMUSCULAR | Status: DC | PRN

## 2017-11-12 MED ORDER — CHLORHEXIDINE GLUCONATE 4 % EX LIQD: 60.0000 mL | Freq: Once | CUTANEOUS | Status: DC

## 2017-11-12 MED ORDER — FENTANYL CITRATE (PF) 100 MCG/2ML IJ SOLN
50.0000 ug | INTRAMUSCULAR | Status: DC | PRN
  Administered 2017-11-12 (×2): 50 ug via INTRAVENOUS

## 2017-11-12 MED ORDER — FENTANYL CITRATE (PF) 100 MCG/2ML IJ SOLN: 25.0000 ug | INTRAMUSCULAR | Status: DC | PRN

## 2017-11-12 MED ORDER — LACTATED RINGERS IV SOLN
INTRAVENOUS | Status: DC
  Administered 2017-11-12: 08:00:00 via INTRAVENOUS

## 2017-11-12 MED ORDER — LIDOCAINE HCL (PF) 0.5 % IJ SOLN
INTRAMUSCULAR | Status: DC | PRN
  Administered 2017-11-12: 30 mL via INTRAVENOUS

## 2017-11-12 MED ORDER — PROPOFOL 500 MG/50ML IV EMUL
INTRAVENOUS | Status: DC | PRN
  Administered 2017-11-12: 50 ug/kg/min via INTRAVENOUS

## 2017-11-12 MED ORDER — SCOPOLAMINE 1 MG/3DAYS TD PT72: 1.0000 | MEDICATED_PATCH | Freq: Once | TRANSDERMAL | Status: DC | PRN

## 2017-11-12 SURGICAL SUPPLY — 39 items
BLADE SURG 15 STRL LF DISP TIS (BLADE) ×1 IMPLANT
BLADE SURG 15 STRL SS (BLADE) ×3
BNDG CMPR 9X4 STRL LF SNTH (GAUZE/BANDAGES/DRESSINGS) ×1
BNDG COHESIVE 3X5 TAN STRL LF (GAUZE/BANDAGES/DRESSINGS) ×3 IMPLANT
BNDG ESMARK 4X9 LF (GAUZE/BANDAGES/DRESSINGS) ×2 IMPLANT
BNDG GAUZE ELAST 4 BULKY (GAUZE/BANDAGES/DRESSINGS) ×3 IMPLANT
CHLORAPREP W/TINT 26ML (MISCELLANEOUS) ×3 IMPLANT
CORD BIPOLAR FORCEPS 12FT (ELECTRODE) ×3 IMPLANT
COVER BACK TABLE 60X90IN (DRAPES) ×3 IMPLANT
COVER MAYO STAND STRL (DRAPES) ×3 IMPLANT
CUFF TOURNIQUET SINGLE 18IN (TOURNIQUET CUFF) ×3 IMPLANT
DRAPE EXTREMITY T 121X128X90 (DRAPE) ×3 IMPLANT
DRAPE SURG 17X23 STRL (DRAPES) ×3 IMPLANT
DRSG PAD ABDOMINAL 8X10 ST (GAUZE/BANDAGES/DRESSINGS) ×3 IMPLANT
GAUZE SPONGE 4X4 12PLY STRL (GAUZE/BANDAGES/DRESSINGS) ×3 IMPLANT
GAUZE XEROFORM 1X8 LF (GAUZE/BANDAGES/DRESSINGS) ×3 IMPLANT
GLOVE BIOGEL PI IND STRL 7.0 (GLOVE) IMPLANT
GLOVE BIOGEL PI IND STRL 7.5 (GLOVE) IMPLANT
GLOVE BIOGEL PI IND STRL 8.5 (GLOVE) ×1 IMPLANT
GLOVE BIOGEL PI INDICATOR 7.0 (GLOVE) ×4
GLOVE BIOGEL PI INDICATOR 7.5 (GLOVE) ×2
GLOVE BIOGEL PI INDICATOR 8.5 (GLOVE) ×2
GLOVE ECLIPSE 6.5 STRL STRAW (GLOVE) ×2 IMPLANT
GLOVE SURG ORTHO 8.0 STRL STRW (GLOVE) ×3 IMPLANT
GLOVE SURG SS PI 7.0 STRL IVOR (GLOVE) ×2 IMPLANT
GOWN STRL REUS W/ TWL LRG LVL3 (GOWN DISPOSABLE) ×2 IMPLANT
GOWN STRL REUS W/TWL LRG LVL3 (GOWN DISPOSABLE) ×6
GOWN STRL REUS W/TWL XL LVL3 (GOWN DISPOSABLE) ×3 IMPLANT
NDL PRECISIONGLIDE 27X1.5 (NEEDLE) IMPLANT
NEEDLE PRECISIONGLIDE 27X1.5 (NEEDLE) ×3 IMPLANT
NS IRRIG 1000ML POUR BTL (IV SOLUTION) ×3 IMPLANT
PACK BASIN DAY SURGERY FS (CUSTOM PROCEDURE TRAY) ×3 IMPLANT
STOCKINETTE 4X48 STRL (DRAPES) ×3 IMPLANT
SUT ETHILON 4 0 PS 2 18 (SUTURE) ×3 IMPLANT
SUT VICRYL 4-0 PS2 18IN ABS (SUTURE) IMPLANT
SYR BULB 3OZ (MISCELLANEOUS) ×3 IMPLANT
SYR CONTROL 10ML LL (SYRINGE) ×2 IMPLANT
TOWEL OR 17X24 6PK STRL BLUE (TOWEL DISPOSABLE) ×3 IMPLANT
UNDERPAD 30X30 (UNDERPADS AND DIAPERS) ×1 IMPLANT

## 2017-11-12 NOTE — Addendum Note (Signed)
Addendum  created 11/12/17 1024 by Cherolyn Behrle, Jewel Baizeimothy D, CRNA   Child order released for a procedure order, Intraprocedure Blocks edited, Sign clinical note

## 2017-11-12 NOTE — Brief Op Note (Signed)
11/12/2017  8:48 AM  PATIENT:  Travis Hamilton  62 y.o. male  PRE-OPERATIVE DIAGNOSIS:  RIGHT CARPAL TUNNEL SYNDROME  POST-OPERATIVE DIAGNOSIS:  RIGHT CARPAL TUNNEL SYNDROME  PROCEDURE:  Procedure(s) with comments: RIGHT CARPAL TUNNEL RELEASE (Right) - FAB  SURGEON:  Surgeon(s) and Role:    * Cindee SaltKuzma, Kathlee Barnhardt, MD - Primary  PHYSICIAN ASSISTANT:   ASSISTANTS: none   ANESTHESIA:   local, regional and IV sedation  EBL: none BLOOD ADMINISTERED:none  DRAINS: none   LOCAL MEDICATIONS USED:  BUPIVICAINE   SPECIMEN:  No Specimen  DISPOSITION OF SPECIMEN:  N/A  COUNTS:  YES  TOURNIQUET:   Total Tourniquet Time Documented: Forearm (Right) - 19 minutes Total: Forearm (Right) - 19 minutes   DICTATION: .Other Dictation: Dictation Number [id number]  PLAN OF CARE: Discharge to home after PACU  PATIENT DISPOSITION:  PACU - hemodynamically stable.

## 2017-11-12 NOTE — Anesthesia Procedure Notes (Signed)
Anesthesia Regional Block: Bier block (IV Regional)   Pre-Anesthetic Checklist: ,, timeout performed, Correct Patient, Correct Site, Correct Laterality, Correct Procedure,, site marked, surgical consent,, at surgeon's request  Laterality: Right     Needles:  Injection technique: Single-shot  Needle Type: Other      Needle Gauge: 22     Additional Needles:   Procedures:,,,,, intact distal pulses, Esmarch exsanguination, single tourniquet utilized,  Narrative:   Performed by: Personally       

## 2017-11-12 NOTE — Op Note (Signed)
Dictation Number [id number]

## 2017-11-12 NOTE — Op Note (Signed)
NAMAltamese Cabal:  Buday, Sanav               ACCOUNT NO.:  [id number]  MEDICAL RECORD NO.:  [id number]  LOCATION:                                 FACILITY:  PHYSICIAN:  Cindee SaltGary Teresia Myint, M.D.            DATE OF BIRTH:  DATE OF PROCEDURE:  11/12/2017 DATE OF DISCHARGE:                              OPERATIVE REPORT   PREOPERATIVE DIAGNOSIS:  Carpal tunnel syndrome, right hand.  POSTOPERATIVE DIAGNOSIS:  Carpal tunnel syndrome, right hand.  OPERATION:  Decompression, right median nerve.  SURGEON:  Cindee SaltGary Filimon Miranda, MD.  ASSISTANT:  None.  ANESTHESIA:  Forearm IV regional with IV sedation, local infiltration.  [facility]  HISTORY:  The patient is a 62 year old male with a history of carpal tunnel syndrome and nerve conductions positive.  This has not responded to conservative treatment.  He has elected to undergo surgical decompression to the median nerve.  Pre, peri, and postoperative courses have been discussed along with risks and complications.  He is aware that there is no guarantee to the surgery, the possibility of infection, recurrence of injury to arteries, nerves, and tendons, incomplete relief of symptoms, and dystrophy.  In the preoperative area, the patient is seen, the extremity marked by both patient and surgeon, and antibiotic given.  DESCRIPTION OF PROCEDURE:  The patient was brought to the operating room where a forearm-based IV regional anesthetic was carried out without difficulty.  He was prepped using ChloraPrep in a supine position with the right arm free.  A 3-minute dry time was allowed and time-out was taken confirming the patient and procedure.  A longitudinal incision was made in the right palm and carried down through subcutaneous tissue. Bleeders were electrocauterized with bipolar.  The palmar fascia was split.  The superficial palmar arch was identified.  The flexor tendon to the ring and little finger was identified.  Retractors were  placed retracting the median nerve radially and the ulnar nerve ulnarly.  The flexor retinaculum was then released on its ulnar border.  A right angle and Sewell retractor were placed between skin and forearm fascia.  The fascia was broadly released with blunt dissection.  The deep structures were then dissected free with blunt dissection.  Blunt scissors were then used to transect the proximal aspect of the flexor retinaculum and the distal forearm fascia for approximately 2 cm proximal to the wrist crease under direct vision.  The canal was explored.  Motor branch entered into muscle distally.  An area of compression to the nerve was apparent.  No further lesions were identified.  The wound was copiously irrigated with saline.  The skin was then closed with interrupted 4-0 nylon sutures.  A local infiltration with 0.25% bupivacaine without epinephrine was given.  Approximately 8 mL to 9 mL was used.  A sterile compressive dressing with the fingers free was applied.  On deflation of the tourniquet, all fingers immediately pinked.  He was taken to the recovery room for observation in satisfactory condition.  He will be discharged to home [facility] [place].  ______________________________ [name], M.D.     GK/MEDQ  D:  11/12/2017  T:  11/12/2017  Job:  [id number]

## 2017-11-12 NOTE — Op Note (Signed)
Dictation Number [id number] 

## 2017-11-12 NOTE — H&P (Signed)
  Travis Hamilton is an 62 y.o. male.   Chief Complaint: numbness right hand [id number]:Travis Hamilton is a 62 year old hand dominant former patient who comes in with increasing symptoms. He was last seen in 2013. He had carpal tunnel [facility] arthritis at that time. He is complaining of numbness and tingling increasing over the past 3-4 months with decreased strength especially of his thumb and dropping things. He has no new history of injury. He states the numbness and tingling is relatively constant on his right side nothing seems to make it better or worse. He has a sharp pain at the base of his thumb he is tried taking aspirin which has given him minimal relief he has a history of diabetes arthritis no history of thyroid problems or gout. Family history is positive rheumatoid arthritis negative for diabetes thyroid problems and gout. He has had nerve conductions done in the past by Dr. Jillyn Hiddenpellagra these were mildly positive. Nerve conductions done by Dr. Riccardo DubinKarvelas reveal a bilateral carpal tunnel syndrome with no response on the right side to either motor or sensory component. His left side shows a motor delay of 6.4 sensory delay 4.6           Past Medical History:  Diagnosis Date  . Diabetes mellitus without complication (HCC)   . GERD (gastroesophageal reflux disease)   . Hernia   . Hyperlipidemia   . Hypertension   . PAT (paroxysmal atrial tachycardia) (HCC)    not had problems in over 15 yrs    Past Surgical History:  Procedure Laterality Date  . HERNIA REPAIR  1989   LOWER LEFT SIDE  . PARTIAL DISC REMOVAL  1982   L-5  . TONSILLECTOMY  1960    Family History  Problem Relation Age of Onset  . Arthritis Sister    Social History:  reports that he quit smoking about 10 years ago. he has never used smokeless tobacco. He reports that he drinks alcohol. He reports that he does not use drugs.  Allergies: No Known Allergies  No medications prior to admission.    No results found for this or any  previous visit (from the past 48 hour(s)).  No results found.   Pertinent items are noted in HPI.  Height 5\' 8"  (1.727 m), weight 99.8 kg (220 lb).  General appearance: alert, cooperative and appears stated age Head: Normocephalic, without obvious abnormality Neck: no JVD Resp: clear to auscultation bilaterally Cardio: regular rate and rhythm, S1, S2 normal, no murmur, click, rub or gallop GI: soft, non-tender; bowel sounds normal; no masses,  no organomegaly Extremities: numbness right hand Pulses: 2+ and symmetric Skin: Skin color, texture, turgor normal. No rashes or lesions Neurologic: Grossly normal Incision/Wound: na  Assessment/Plan Assessment:  1. Bilateral carpal tunnel syndrome  2. Primary osteoarthritis of both first carpometacarpal joints    Plan: We have discussed his nerve conductions with him. We have discussed possibility of surgical decompression of the median nerve. Would not recommend any [facility]. Would not recommend any treatment for Limberg connection. Pre-peri-and postoperative course were discussed along with risks and complications. He is aware that there is no guarantee to the surgery the possibility of infection recurrence injury to arteries nerves tendons incomplete release symptoms and dystrophy. Like to proceed. He is scheduled for carpal tunnel release right hand as an outpatient under regional anesthesia.      Persais Ethridge R 11/12/2017, 5:33 AM

## 2017-11-12 NOTE — Anesthesia Postprocedure Evaluation (Signed)
Anesthesia Post Note  Patient: Travis Hamilton  Procedure(s) Performed: RIGHT CARPAL TUNNEL RELEASE (Right Wrist)     Patient location during evaluation: PACU Anesthesia Type: MAC Level of consciousness: awake and alert Pain management: pain level controlled Vital Signs Assessment: post-procedure vital signs reviewed and stable Respiratory status: spontaneous breathing and respiratory function stable Cardiovascular status: stable Postop Assessment: no apparent nausea or vomiting Anesthetic complications: no    Last Vitals:  Vitals:   11/12/17 0850 11/12/17 0900  BP: 125/70 116/83  Pulse: 84 89  Resp: 13 14  Temp: 36.6 C   SpO2: 99% 94%    Last Pain:  Vitals:   11/12/17 0915  TempSrc:   PainSc: 0-No pain                 Avanelle Pixley DANIEL

## 2017-11-12 NOTE — Anesthesia Procedure Notes (Signed)
Procedure Name: MAC Date/Time: 11/12/2017 8:31 AM Performed by: Signe Colt, CRNA Pre-anesthesia Checklist: Patient identified, Emergency Drugs available, Suction available, Patient being monitored and Timeout performed Patient Re-evaluated:Patient Re-evaluated prior to induction Oxygen Delivery Method: Simple face mask

## 2017-11-12 NOTE — Transfer of Care (Signed)
Immediate Anesthesia Transfer of Care Note  Patient: Petra KubaStuart D Olano  Procedure(s) Performed: RIGHT CARPAL TUNNEL RELEASE (Right Wrist)  Patient Location: PACU  Anesthesia Type:Bier block  Level of Consciousness: awake, alert , oriented and patient cooperative  Airway & Oxygen Therapy: Patient Spontanous Breathing and Patient connected to face mask oxygen  Post-op Assessment: Report given to RN and Post -op Vital signs reviewed and stable  Post vital signs: Reviewed and stable  Last Vitals:  Vitals:   11/12/17 0749  BP: 123/66  Pulse: 84  Resp: 16  Temp: 36.7 C  SpO2: 98%    Last Pain:  Vitals:   11/12/17 0749  TempSrc: Oral  PainSc: 0-No pain         Complications: No apparent anesthesia complications

## 2017-11-12 NOTE — Anesthesia Preprocedure Evaluation (Addendum)
Anesthesia Evaluation  Patient identified by MRN, date of birth, ID band Patient awake    Reviewed: Allergy & Precautions, NPO status , Patient's Chart, lab work & pertinent test results  History of Anesthesia Complications Negative for: history of anesthetic complications  Airway Mallampati: III  TM Distance: >3 FB   Mouth opening: Limited Mouth Opening  Dental  (+) Dental Advisory Given, Partial Upper   Pulmonary sleep apnea , former smoker,    Pulmonary exam normal        Cardiovascular hypertension, Pt. on medications negative cardio ROS Normal cardiovascular exam     Neuro/Psych negative neurological ROS  negative psych ROS   GI/Hepatic Neg liver ROS, GERD  ,  Endo/Other  diabetes  Renal/GU negative Renal ROS  negative genitourinary   Musculoskeletal negative musculoskeletal ROS (+)   Abdominal   Peds negative pediatric ROS (+)  Hematology negative hematology ROS (+)   Anesthesia Other Findings   Reproductive/Obstetrics negative OB ROS                            Anesthesia Physical Anesthesia Plan  ASA: II  Anesthesia Plan: MAC and Bier Block and Bier Block-LIDOCAINE ONLY   Post-op Pain Management:    Induction:   PONV Risk Score and Plan: 1 and Ondansetron  Airway Management Planned: Natural Airway and Simple Face Mask  Additional Equipment:   Intra-op Plan:   Post-operative Plan:   Informed Consent: I have reviewed the patients History and Physical, chart, labs and discussed the procedure including the risks, benefits and alternatives for the proposed anesthesia with the patient or authorized representative who has indicated his/her understanding and acceptance.   Dental advisory given  Plan Discussed with: CRNA and Anesthesiologist  Anesthesia Plan Comments:        Anesthesia Quick Evaluation

## 2017-11-12 NOTE — Discharge Instructions (Addendum)
Post Anesthesia Home Care Instructions  Activity: Get plenty of rest for the remainder of the day. A responsible individual must stay with you for 24 hours following the procedure.  For the next 24 hours, DO NOT: -Drive a car -Operate machinery -Drink alcoholic beverages -Take any medication unless instructed by your physician -Make any legal decisions or sign important papers.  Meals: Start with liquid foods such as gelatin or soup. Progress to regular foods as tolerated. Avoid greasy, spicy, heavy foods. If nausea and/or vomiting occur, drink only clear liquids until the nausea and/or vomiting subsides. Call your physician if vomiting continues.  Special Instructions/Symptoms: Your throat may feel dry or sore from the anesthesia or the breathing tube placed in your throat during surgery. If this causes discomfort, gargle with warm salt water. The discomfort should disappear within 24 hours.  If you had a scopolamine patch placed behind your ear for the management of post- operative nausea and/or vomiting:  1. The medication in the patch is effective for 72 hours, after which it should be removed.  Wrap patch in a tissue and discard in the trash. Wash hands thoroughly with soap and water. 2. You may remove the patch earlier than 72 hours if you experience unpleasant side effects which may include dry mouth, dizziness or visual disturbances. 3. Avoid touching the patch. Wash your hands with soap and water after contact with the patch.     [facility] Instructions Hand Surgery  Wound Care: Keep your hand elevated above the level of your heart.  Do not allow it to dangle by your side.  Keep the dressing dry and do not remove it unless your doctor advises you to do so.  He will usually change it at the time of your post-op visit.  Moving your fingers is advised to stimulate circulation but will depend on the site of your surgery.  If you have a splint applied, your doctor will advise you  regarding movement.  Activity: Do not drive or operate machinery today.  Rest today and then you may return to your normal activity and work as indicated by your physician.  Diet:  Drink liquids today or eat a light diet.  You may resume a regular diet tomorrow.    General expectations: Pain for two to three days. Fingers may become slightly swollen.  Call your doctor if any of the following occur: Severe pain not relieved by pain medication. Elevated temperature. Dressing soaked with blood. Inability to move fingers. White or bluish color to fingers.    Post Anesthesia Home Care Instructions  Activity: Get plenty of rest for the remainder of the day. A responsible individual must stay with you for 24 hours following the procedure.  For the next 24 hours, DO NOT: -Drive a car -Operate machinery -Drink alcoholic beverages -Take any medication unless instructed by your physician -Make any legal decisions or sign important papers.  Meals: Start with liquid foods such as gelatin or soup. Progress to regular foods as tolerated. Avoid greasy, spicy, heavy foods. If nausea and/or vomiting occur, drink only clear liquids until the nausea and/or vomiting subsides. Call your physician if vomiting continues.  Special Instructions/Symptoms: Your throat may feel dry or sore from the anesthesia or the breathing tube placed in your throat during surgery. If this causes discomfort, gargle with warm salt water. The discomfort should disappear within 24 hours.  If you had a scopolamine patch placed behind your ear for the management of post- operative nausea and/or   vomiting:  1. The medication in the patch is effective for 72 hours, after which it should be removed.  Wrap patch in a tissue and discard in the trash. Wash hands thoroughly with soap and water. 2. You may remove the patch earlier than 72 hours if you experience unpleasant side effects which may include dry mouth, dizziness or  visual disturbances. 3. Avoid touching the patch. Wash your hands with soap and water after contact with the patch.     

## 2017-11-13 ENCOUNTER — Encounter (HOSPITAL_BASED_OUTPATIENT_CLINIC_OR_DEPARTMENT_OTHER): Payer: Self-pay | Admitting: Orthopedic Surgery

## 2019-08-31 ENCOUNTER — Other Ambulatory Visit: Payer: Self-pay | Admitting: Family Medicine

## 2019-08-31 DIAGNOSIS — F17210 Nicotine dependence, cigarettes, uncomplicated: Secondary | ICD-10-CM

## 2021-11-29 ENCOUNTER — Emergency Department (HOSPITAL_COMMUNITY): Payer: PRIVATE HEALTH INSURANCE

## 2021-11-29 ENCOUNTER — Other Ambulatory Visit: Payer: Self-pay

## 2021-11-29 ENCOUNTER — Emergency Department (HOSPITAL_COMMUNITY)
Admission: EM | Admit: 2021-11-29 | Discharge: 2021-11-29 | Disposition: A | Discharge status: Home / Self Care | Payer: PRIVATE HEALTH INSURANCE | Attending: Emergency Medicine | Admitting: Emergency Medicine

## 2021-11-29 DIAGNOSIS — R059 Cough, unspecified: Secondary | ICD-10-CM | POA: Diagnosis present

## 2021-11-29 DIAGNOSIS — R0602 Shortness of breath: Secondary | ICD-10-CM | POA: Diagnosis not present

## 2021-11-29 DIAGNOSIS — I10 Essential (primary) hypertension: Secondary | ICD-10-CM | POA: Diagnosis not present

## 2021-11-29 DIAGNOSIS — J3489 Other specified disorders of nose and nasal sinuses: Secondary | ICD-10-CM | POA: Diagnosis not present

## 2021-11-29 DIAGNOSIS — R051 Acute cough: Secondary | ICD-10-CM | POA: Diagnosis not present

## 2021-11-29 DIAGNOSIS — R0981 Nasal congestion: Secondary | ICD-10-CM | POA: Diagnosis not present

## 2021-11-29 DIAGNOSIS — Z7982 Long term (current) use of aspirin: Secondary | ICD-10-CM | POA: Insufficient documentation

## 2021-11-29 DIAGNOSIS — R0982 Postnasal drip: Secondary | ICD-10-CM | POA: Insufficient documentation

## 2021-11-29 DIAGNOSIS — J029 Acute pharyngitis, unspecified: Secondary | ICD-10-CM | POA: Insufficient documentation

## 2021-11-29 DIAGNOSIS — Z20822 Contact with and (suspected) exposure to covid-19: Secondary | ICD-10-CM | POA: Insufficient documentation

## 2021-11-29 DIAGNOSIS — Z79899 Other long term (current) drug therapy: Secondary | ICD-10-CM | POA: Insufficient documentation

## 2021-11-29 LAB — CBC WITH DIFFERENTIAL/PLATELET
Abs Immature Granulocytes: 0.04 10*3/uL (ref 0.00–0.07)
Basophils Absolute: 0.1 10*3/uL (ref 0.0–0.1)
Basophils Relative: 1 %
Eosinophils Absolute: 0.4 10*3/uL (ref 0.0–0.5)
Eosinophils Relative: 6 %
HCT: 47.2 % (ref 39.0–52.0)
Hemoglobin: 15.5 g/dL (ref 13.0–17.0)
Immature Granulocytes: 1 %
Lymphocytes Relative: 32 %
Lymphs Abs: 2.3 10*3/uL (ref 0.7–4.0)
MCH: 28.9 pg (ref 26.0–34.0)
MCHC: 32.8 g/dL (ref 30.0–36.0)
MCV: 87.9 fL (ref 80.0–100.0)
Monocytes Absolute: 1.1 10*3/uL — ABNORMAL HIGH (ref 0.1–1.0)
Monocytes Relative: 15 %
Neutro Abs: 3.3 10*3/uL (ref 1.7–7.7)
Neutrophils Relative %: 45 %
Platelets: 197 10*3/uL (ref 150–400)
RBC: 5.37 MIL/uL (ref 4.22–5.81)
RDW: 12.6 % (ref 11.5–15.5)
WBC: 7.3 10*3/uL (ref 4.0–10.5)
nRBC: 0 % (ref 0.0–0.2)

## 2021-11-29 LAB — BASIC METABOLIC PANEL
Anion gap: 10 (ref 5–15)
BUN: 13 mg/dL (ref 8–23)
CO2: 23 mmol/L (ref 22–32)
Calcium: 9.5 mg/dL (ref 8.9–10.3)
Chloride: 104 mmol/L (ref 98–111)
Creatinine, Ser: 1.38 mg/dL — ABNORMAL HIGH (ref 0.61–1.24)
GFR, Estimated: 56 mL/min — ABNORMAL LOW (ref >60)
Glucose, Bld: 145 mg/dL — ABNORMAL HIGH (ref 70–99)
Potassium: 4.9 mmol/L (ref 3.5–5.1)
Sodium: 137 mmol/L (ref 135–145)

## 2021-11-29 LAB — RESP PANEL BY RT-PCR (FLU A&B, COVID) ARPGX2
Influenza A by PCR: NEGATIVE
Influenza B by PCR: NEGATIVE
SARS Coronavirus 2 by RT PCR: NEGATIVE

## 2021-11-29 LAB — BRAIN NATRIURETIC PEPTIDE: B Natriuretic Peptide: 18.6 pg/mL (ref 0.0–100.0)

## 2021-11-29 MED ORDER — ALBUTEROL SULFATE HFA 108 (90 BASE) MCG/ACT IN AERS
1.0000 | INHALATION_SPRAY | Freq: Once | RESPIRATORY_TRACT | Status: AC
  Administered 2021-11-29: 1 via RESPIRATORY_TRACT
  Filled 2021-11-29: qty 6.7

## 2021-11-29 NOTE — ED Provider Triage Note (Signed)
Emergency Medicine Provider Triage Evaluation Note  Travis Hamilton , a 66 y.o. male  was evaluated in triage.  Pt complains of SOB. Pt has cold sxs x 5 days.  Got a bit better but once returning to work today he felt sob, chest tightness, and more congested.  Does have hx of DM, HTN, PAT.  No hx of COPD, asthma  Review of Systems  Positive: Congestion, sob, chest tightness Negative: Fever, productive cough, exertional cp  Physical Exam  BP (!) 161/86 (BP Location: Right Arm)    Pulse (!) 107    Temp 98.1 F (36.7 C) (Oral)    Resp 20    SpO2 95%  Gen:   Awake, no distress   Resp:  Normal effort  MSK:   Moves extremities without difficulty  Other:  Lungs CTAB  Medical Decision Making  Medically screening exam initiated at 9:10 AM.  Appropriate orders placed.  Travis Hamilton was informed that the remainder of the evaluation will be completed by another provider, this initial triage assessment does not replace that evaluation, and the importance of remaining in the ED until their evaluation is complete.  Check viral resp panel, and given age will obtain labs, EKG and CXR.   Fayrene Helper, PA-C 11/29/21 [phone]

## 2021-11-29 NOTE — ED Triage Notes (Signed)
Pt. Stated, ive had a cold and congestion since last Saturday and today I cant hardly breath.

## 2021-11-29 NOTE — Discharge Instructions (Addendum)
You were seen here today for evaluation of your cough and cold symptoms.  Your lab work showed a mildly elevated glucose at 145, but you do have known diabetes.  Your CBC does not show any signs of infection and you tested negative for COVID and flu.  Your chest x-ray was also negative.  I suspect this is likely a viral illness.  You can continue using Mucinex and the albuterol inhaler given to you here. The albuterol inhaler and the mucinex can cause palpitations and a fast heart rate. Please make sure you are drinking plenty of fluids and staying well-hydrated.  Make sure you are coughing up the phlegm instead of swallowing it down and please make sure you are blowing your nose.  I have written you a work note for a few days off for you to rest and recuperate.  If you are not feeling better in the next week, please see your PCP for reevaluation.  If you have chest pain, shortness of breath, lightheadedness, weakness, coughing up blood, please return to the nearest emergency room for evaluation.

## 2021-11-29 NOTE — ED Provider Notes (Signed)
Travis Hamilton   CSN: [id number] Arrival date & time: 11/29/21  8657     History Chief Complaint  Patient presents with   Nasal Congestion   Shortness of Breath    Travis Hamilton is a 66 y.o. male prediabetes, GERD, hypertension, hyperlipidemia presents emergency department for evaluation of cough and cold symptoms on Saturday.  Patient reports that had nasal congestion, rhinorrhea, productive cough, initial sore throat, but not now and some mild postnasal drip.  He denies any fever or chest pain.  He reports some mild shortness of breath when he gets in a coughing fit feels like his throat is closing "and is have a hard time breathing". He reports when this happens, he can breath through his nose, but has trouble breathing out his mouth. Shortly after this feeling, he reports that he is able to cough up mucus and the feeling goes away. The patient has tried Sudafed and Guaifenesin with some relief of symptoms, well enough to return to work today after being off for two days. The trouble breathing problem is what led him to the ER.    Shortness of Breath Associated symptoms: cough and sore throat   Associated symptoms: no abdominal pain, no chest pain, no fever and no vomiting       Home Medications Prior to Admission medications   Medication Sig Start Date End Date Taking? Authorizing Provider  aspirin-acetaminophen-caffeine (EXCEDRIN MIGRAINE) [phone] MG tablet Take 1 tablet by mouth daily with breakfast.    [provider]  glucosamine-chondroitin 500-400 MG tablet Take 1 tablet by mouth 3 (three) times daily.    [provider]  HYDROcodone-acetaminophen (NORCO) 5-325 MG tablet Take 1 tablet by mouth every 6 (six) hours as needed. 11/12/17   [name], MD  lisinopril (PRINIVIL,ZESTRIL) 20 MG tablet Take 20 mg by mouth daily.      [provider]  lovastatin (MEVACOR) 40 MG tablet Take 40 mg by mouth  at bedtime.      [provider]  metFORMIN (GLUCOPHAGE) 500 MG tablet Take by mouth daily with breakfast.    [provider]  naproxen sodium (ANAPROX) 220 MG tablet Take 220 mg by mouth 2 (two) times daily with a meal.      [provider]      Allergies    Patient has no known allergies.    Review of Systems   Review of Systems  Constitutional:  Negative for chills and fever.  HENT:  Positive for congestion, rhinorrhea and sore throat.   Respiratory:  Positive for cough and shortness of breath.   Cardiovascular:  Negative for chest pain and palpitations.  Gastrointestinal:  Negative for abdominal pain, constipation, nausea and vomiting.  Musculoskeletal:  Negative for myalgias.  Neurological:  Negative for weakness and light-headedness.   Physical Exam Updated Vital Signs BP 116/60    Pulse (!) 103    Temp 98.1 F (36.7 C) (Oral)    Resp 20    SpO2 96%  Physical Exam Vitals and nursing Hamilton reviewed.  Constitutional:      General: He is not in acute distress.    Appearance: Normal appearance. He is not ill-appearing, toxic-appearing or diaphoretic.     Comments: Well-appearing patient, sitting on a stretcher, who is speaking in full sentences with ease and is in no acute distress.  HENT:     Right Ear: Tympanic membrane, ear canal and external ear normal.  Left Ear: Ear canal and external ear normal.     Ears:     Comments: Some scarring to the left TM, otherwise normal.    Nose:     Comments: Bilateral nasal edema and erythema with scant clear nasal discharge.  Patient does have a small amount of blood in the right nare.  Source visualized on the anterior part of the nose there is a small superficial scratch.    Mouth/Throat:     Mouth: Mucous membranes are moist.     Pharynx: Oropharynx is clear. No pharyngeal swelling or oropharyngeal exudate.     Comments: No tonsils visualized.  No pharyngeal erythema, edema, or exudate noted.  Uvula  midline.  Airway patent.  Moist mucous membranes. Eyes:     General: No scleral icterus. Neck:     Trachea: No tracheal deviation.  Cardiovascular:     Rate and Rhythm: Regular rhythm. Tachycardia present.     Heart sounds: No murmur heard. Pulmonary:     Effort: Pulmonary effort is normal. No tachypnea, accessory muscle usage or respiratory distress.     Breath sounds: Normal breath sounds.     Comments: Clear to auscultation bilaterally.  The patient has no respiratory distress, assessor muscle use, tripoding, nasal flaring, or cyanosis present.  The patient is speaking in full sentences with ease and is satting 95% on room air. Abdominal:     General: Abdomen is flat. Bowel sounds are normal.     Palpations: Abdomen is soft.  Musculoskeletal:        General: No deformity.     Cervical back: Normal range of motion and neck supple.  Lymphadenopathy:     Cervical: No cervical adenopathy.  Skin:    General: Skin is warm and dry.  Neurological:     General: No focal deficit present.     Mental Status: He is alert. Mental status is at baseline.    ED Results / Procedures / Treatments   Labs (all labs ordered are listed, but only abnormal results are displayed) Labs Reviewed  BASIC METABOLIC PANEL - Abnormal; Notable for the following components:      Result Value   Glucose, Bld 145 (*)    Creatinine, Ser 1.38 (*)    GFR, Estimated 56 (*)    All other components within normal limits  CBC WITH DIFFERENTIAL/PLATELET - Abnormal; Notable for the following components:   Monocytes Absolute 1.1 (*)    All other components within normal limits  RESP PANEL BY RT-PCR (FLU A&B, COVID) ARPGX2  BRAIN NATRIURETIC PEPTIDE    EKG EKG Interpretation  Date/Time:  Wednesday November 29 2021 08:38:42 EST Ventricular Rate:  107 PR Interval:  122 QRS Duration: 84 QT Interval:  334 QTC Calculation: 445 R Axis:   80 Text Interpretation: Sinus tachycardia Otherwise normal ECG When compared  with ECG of 07-Nov-2017 11:28, PREVIOUS ECG IS PRESENT faster rate than prior. NO STEMI Confirmed by Theda Belfast ([id number]) on 11/29/2021 12:10:26 PM  Radiology DG Chest 2 View  Result Date: 11/29/2021 CLINICAL DATA:  Pt c/o SOB x 3 days. Hx of DM, GERD, HTN. Pt is a former smoker.sob EXAM: CHEST - 2 VIEW COMPARISON:  None. FINDINGS: Normal mediastinum and cardiac silhouette. Normal pulmonary vasculature. No evidence of effusion, infiltrate, or pneumothorax. No acute bony abnormality. IMPRESSION: No acute cardiopulmonary process. Electronically Signed   By: Genevive Bi M.D.   On: 11/29/2021 09:40    Procedures Procedures   Medications Ordered in ED Medications  albuterol (VENTOLIN HFA) 108 (90 Base) MCG/ACT inhaler 1 puff (1 puff Inhalation Given 11/29/21 1251)    ED Course/ Medical Decision Making/ A&P                           Medical Decision Making Risk Prescription drug management.   66 year old male presents emerged department for evaluation of cough and cold symptoms.  Differential diagnosis includes is not limited to COVID, flu PTA, epiglottitis, viral illness, pneumonia, deep space infection.  Vital signs are stable.  Patient initially presented mildly tachycardic although I suspect this is due to being in the emergency department and also his Sudafed use.  The patient was initially hypertensive but is now normotensive.  Afebrile.  Normal pulse rate.  Satting 95% on room air.  The patient is very well-appearing and is in no acute distress speaking in full sentences with ease.  He has clear lung sounds.  Normal OP, clear ears.  He does have some bilateral nasal turbinate erythema and edema with scant clear nasal discharge as well as a small abrasion noted to the midline of the right nare with scant blood.  Trachea is midline.  I do not appreciate any cervical adenopathy.  Patient as well as full range of motion of his neck.  Hamilton mass or deep space induration/fluctuance palpated.   Labs and imaging was ordered in triage.  I reviewed and interpreted the patient's lab and imaging and agree with radiologist findings.  CBC shows no signs leukocytosis or anemia.  BMP shows slightly elevated glucose at 145 the patient's known diabetic.  Elevated creatinine of 1.38 although baseline known as the patient's previous creatinine was from 4 years ago was 0.99.  His creatinine 2 years before that was 1.29 though.  This is most likely his baseline or slightly increased.  Negative for COVID and flu.  BNP 18.6.  Chest x-ray shows no acute cardiopulmonary process.  EKG shows sinus tachycardia.  There was a faster rate from his previous EKG in 2019 otherwise, no changes.  No STEMI noted.  I have very low suspicion for any epiglottitis or infection causing this patient's shortness of breath and his throat.  I think this is likely due to the amount of mucus that he is swallowing instead of spitting up as he can breathe out of his nose but not his mouth as well.  He has clear lung sounds again.  Given this, I had a shared decision making with the patient and wife at bedside about ordering a CT scan of the neck for this shortness of breath, throat closing feeling after coughing fit to rule out things like abscess, PTA, epiglottitis.  I discussed the risk and benefits of the procedure and offered CT of the neck.  The patient declines at this time and reports that he really thinks it is from his mucus, and declines the CT at this time. He reports if he feels it again, he may change his mind. I instructed the patient to please let me or any of the staff know immediately.   The patient was given albuterol by the emergency department reports some relief with his symptoms.  On re-evaluation, the patient reports he is doing much better and would like to go home. I offered the CT scan to him again, but the patient declines.  I recommended the patient continue with the Mucinex and albuterol inhaler he was given in  the emergency department.  I advised  him to cough up his mucus instead of swallowing it.  I gave him a few days off of work additionally.  I recommended he does not feel better in the next few days to return to his primary care for reevaluation.  Red flag symptoms and strict return precautions were discussed with the patient who verbalizes understanding and agrees to plan.  The patient is stable and being discharged home in good condition.  I discussed this case with my attending physician who cosigned this Hamilton including patient's presenting symptoms, physical exam, and planned diagnostics and interventions. Attending physician stated agreement with plan or made changes to plan which were implemented.   Final Clinical Impression(s) / ED Diagnoses Final diagnoses:  Acute cough    Rx / DC Orders ED Discharge Orders     None         Achille RichRansom, Yaremi Stahlman, PA-C 11/29/21 1818    Tegeler, Canary Brimhristopher J, MD 11/30/21 [phone]

## 2021-11-29 NOTE — ED Notes (Signed)
Patient verbalizes understanding of discharge instructions. Opportunity for questioning and answers were provided. Armband removed by staff, pt discharged from ED and ambulated to lobby to return home with SO.  

## 2022-05-25 ENCOUNTER — Ambulatory Visit: Payer: Self-pay | Admitting: Surgery

## 2022-06-25 ENCOUNTER — Ambulatory Visit: Payer: 59 | Attending: Cardiology | Admitting: Cardiology

## 2022-06-25 ENCOUNTER — Encounter: Payer: Self-pay | Admitting: Cardiology

## 2022-06-25 VITALS — BP 120/74 | HR 79 | Ht 68.0 in | Wt 221.0 lb

## 2022-06-25 DIAGNOSIS — R0602 Shortness of breath: Secondary | ICD-10-CM

## 2022-06-25 DIAGNOSIS — Z01812 Encounter for preprocedural laboratory examination: Secondary | ICD-10-CM

## 2022-06-25 DIAGNOSIS — I209 Angina pectoris, unspecified: Secondary | ICD-10-CM | POA: Diagnosis not present

## 2022-06-25 DIAGNOSIS — Z01810 Encounter for preprocedural cardiovascular examination: Secondary | ICD-10-CM | POA: Diagnosis not present

## 2022-06-25 MED ORDER — METOPROLOL TARTRATE 100 MG PO TABS: 100.0000 mg | ORAL_TABLET | ORAL | 0 refills | Status: DC

## 2022-06-25 NOTE — Progress Notes (Signed)
Cardiology Office Note:    Date:  06/25/2022   ID:  Travis Hamilton, DOB 1956/06/20, MRN [id number]  PCP:  [name], MD   [facility] Providers Cardiologist:  None     Referring MD: [name], MD    History of Present Illness:    Travis Hamilton is a 66 y.o. male here for preoperative risk stratification at the request of Dr. Duanne Hamilton.  He has upcoming hernia repair, right by Dr. Luisa Hamilton.  Has a history of paroxysmal atrial tachycardia but has not had any issues in close to 20 years.  Has diabetes hypertension hyperlipidemia.  Takes insulin.  Quit smoking in 2008.  Winded (shower, around house). Terminix. Brother in law CABG. Grandfather had carotid endarterectomies.  He has been noticing over the last year or so sensation of significant shortness of breath when walking around the house.  Seems out of proportion to activity at times.  No fainting.  Sometimes will get slight dizziness when standing up quickly for years.    Past Medical History:  Diagnosis Date   Diabetes mellitus without complication (HCC)    GERD (gastroesophageal reflux disease)    Hernia    Hyperlipidemia    Hypertension    PAT (paroxysmal atrial tachycardia) (HCC)    not had problems in over 15 yrs    Past Surgical History:  Procedure Laterality Date   CARPAL TUNNEL RELEASE Right 11/12/2017   Procedure: RIGHT CARPAL TUNNEL RELEASE;  Surgeon: [name], MD;  Location: [facility];  Service: Orthopedics;  Laterality: Right;  FAB   HERNIA REPAIR  1989   LOWER LEFT SIDE   PARTIAL DISC REMOVAL  1982   L-5   TONSILLECTOMY  1960    Current Medications: Current Meds  Medication Sig   aspirin-acetaminophen-caffeine (EXCEDRIN MIGRAINE) 250-250-65 MG tablet Take 2 tablets by mouth daily.   famotidine (PEPCID) 40 MG tablet Take 40 mg by mouth daily.   insulin glargine, 1 Unit Dial, (TOUJEO SOLOSTAR) 300 UNIT/ML Solostar Pen INJECT 8 UNITS SUBCUTANEOUSLY ONCE DAILY.    lisinopril (ZESTRIL) 40 MG tablet Take 80 mg by mouth daily.   lovastatin (MEVACOR) 40 MG tablet Take 40 mg by mouth at bedtime.     metoprolol tartrate (LOPRESSOR) 100 MG tablet Take 1 tablet (100 mg total) by mouth as directed. Take 1 tablet 2 hours prior to your CT scan   Misc Natural Products (GLUCOSAMINE CHOND CMP TRIPLE PO)    SYNJARDY XR 25-1000 MG TB24 Take 1 tablet by mouth daily.     Allergies:   Patient has no known allergies.   Social History   Socioeconomic History   Marital status: Married    Spouse name: Not on file   Number of children: Not on file   Years of education: Not on file   Highest education level: Not on file  Occupational History   Not on file  Tobacco Use   Smoking status: Former    Types: Cigarettes    Quit date: 02/13/2007    Years since quitting: 15.3   Smokeless tobacco: Never  Substance and Sexual Activity   Alcohol use: Yes    Comment: ocassionally   Drug use: No   Sexual activity: Not on file  Other Topics Concern   Not on file  Social History Narrative   Not on file   Social Determinants of Health   Financial Resource Strain: Not on file  Food Insecurity: Not on file  Transportation Needs:  Not on file  Physical Activity: Not on file  Stress: Not on file  Social Connections: Not on file     Family History: The patient's family history includes Arthritis in his sister.  ROS:   Please see the history of present illness.     All other systems reviewed and are negative.  EKGs/Labs/Other Studies Reviewed:    The following studies were reviewed today: Prior EKG reviewed, prior ER notes reviewed lab work reviewed.  Office referral notes reviewed  EKG:  EKG is  ordered today.  The ekg ordered today demonstrates sinus rhythm 79 with nonspecific ST-T wave changes EKG 11/30/2021-sinus tachycardia 107 no ischemic changes.  Recent Labs: 11/29/2021: B Natriuretic Peptide 18.6; BUN 13; Creatinine, Ser 1.38; Hemoglobin 15.5; Platelets 197;  Potassium 4.9; Sodium 137  Recent Lipid Panel No results found for: "CHOL", "TRIG", "HDL", "CHOLHDL", "VLDL", "LDLCALC", "LDLDIRECT"   Risk Assessment/Calculations:              Physical Exam:    VS:  BP 120/74   Pulse 79   Ht 5\' 8"  (1.727 m)   Wt 221 lb (100.2 kg)   SpO2 95%   BMI 33.60 kg/m     Wt Readings from Last 3 Encounters:  06/25/22 221 lb (100.2 kg)  11/12/17 223 lb (101.2 kg)  10/19/15 223 lb (101.2 kg)     GEN:  Well nourished, well developed in no acute distress HEENT: Normal NECK: No JVD; No carotid bruits LYMPHATICS: No lymphadenopathy CARDIAC: RRR, no murmurs, no rubs, gallops RESPIRATORY:  Clear to auscultation without rales, wheezing or rhonchi  ABDOMEN: Soft, non-tender, non-distended MUSCULOSKELETAL:  No edema; No deformity  SKIN: Warm and dry NEUROLOGIC:  Alert and oriented x 3 PSYCHIATRIC:  Normal affect   ASSESSMENT:    1. Shortness of breath   2. Angina pectoris (HCC)   3. Pre-procedure lab exam   4. Pre-operative cardiovascular examination    PLAN:    In order of problems listed above:  Dyspnea - We will check an echocardiogram to ensure proper structure and function of his heart.  Angina - Certainly significant shortness of breath with physical activity could be symptoms of angina or coronary flow limitation.  His brother-in-law just recently ended up with bypass surgery.  There are nonspecific ST-T wave changes noted on ECG.  He is a former smoker, diabetic with hypertension hyperlipidemia.  Lets proceed with coronary CT scan with possible FFR analysis.  This will be helpful to describe any signs of calcified or noncalcified plaque that may be present.  Diabetes with hypertension hyperlipidemia - Last hemoglobin A1c 7.0, creatinine 1.38 LDL 92 triglycerides 96 HDL 33.  Currently taking lovastatin 40 mg at bedtime.  Could consider transition over to Crestor 20 mg if significant coronary plaque is present. -Lisinopril 40 mg.  Renal  protective  Former smoker - Able to quit in 2008 with Chantix.  Paroxysmal atrial tachycardia - Has been years since he has felt any episodes.  Used to have racing heart rates.  He will close his eyes take deep breaths and it would gradually decrease.  Previously wore a Holter monitor and it showed episodes only at night while sleeping.  We will follow-up with results of study.  Preoperative risk stratification based upon these studies.   Shared Decision Making/Informed Consent     Medication Adjustments/Labs and Tests Ordered: Current medicines are reviewed at length with the patient today.  Concerns regarding medicines are outlined above.  Orders Placed This Encounter  Procedures   CT CORONARY MORPH W/CTA COR W/SCORE W/CA W/CM &/OR WO/CM   Basic metabolic panel   EKG 12-Lead   ECHOCARDIOGRAM COMPLETE   Meds ordered this encounter  Medications   metoprolol tartrate (LOPRESSOR) 100 MG tablet    Sig: Take 1 tablet (100 mg total) by mouth as directed. Take 1 tablet 2 hours prior to your CT scan    Dispense:  1 tablet    Refill:  0    Patient Instructions  Medication Instructions:  The current medical regimen is effective;  continue present plan and medications.  *If you need a refill on your cardiac medications before your next appointment, please call your pharmacy*   Lab Work: Please have blood work today (BMP)  If you have labs (blood work) drawn today and your tests are completely normal, you will receive your results only by: MyChart Message (if you have MyChart) OR A paper copy in the mail If you have any lab test that is abnormal or we need to change your treatment, we will call you to review the results.   Testing/Procedures: Your physician has requested that you have an echocardiogram. Echocardiography is a painless test that uses sound waves to create images of your heart. It provides your doctor with information about the size and shape of your heart and how  well your heart's chambers and valves are working. This procedure takes approximately one hour. There are no restrictions for this procedure.    Your cardiac CT will be scheduled at:   [facility] [address] [place], [place] [address] [phone]  Please arrive at the [facility] (Entrance C2) of [facility] 30 minutes prior to test start time. You can use the FREE valet parking offered at entrance C (encouraged to control the heart rate for the test)  Proceed to the [facility] Radiology Department (first floor) to check-in and test prep.  All radiology patients and guests should use entrance C2 at [facility], accessed from [facility], even though the hospital's physical address listed is [address]     Please follow these instructions carefully (unless otherwise directed):  Hold all erectile dysfunction medications at least 3 days (72 hrs) prior to test. (Ie viagra, cialis, sildenafil, tadalafil, etc) We will administer nitroglycerin during this exam.   On the Night Before the Test: Be sure to Drink plenty of water. Do not consume any caffeinated/decaffeinated beverages or chocolate 12 hours prior to your test. Do not take any antihistamines 12 hours prior to your test.  On the Day of the Test: Drink plenty of water until 1 hour prior to the test. You may take your regular medications prior to the test.  Take metoprolol (Lopressor) two hours prior to test. HOLD Furosemide/Hydrochlorothiazide morning of the test.  After the Test: Drink plenty of water. After receiving IV contrast, you may experience a mild flushed feeling. This is normal. On occasion, you may experience a mild rash up to 24 hours after the test. This is not dangerous. If this occurs, you can take Benadryl 25 mg and increase your fluid intake. If you experience trouble breathing, this can be serious. If it is severe call 911  IMMEDIATELY. If it is mild, please call our office. If you take any of these medications: Glipizide/Metformin, Avandament, Glucavance, please do not take 48 hours after completing test unless otherwise instructed.  We will call to schedule your test 2-4 weeks out  understanding that some insurance companies will need an authorization prior to the service being performed.   For non-scheduling related questions, please contact the cardiac imaging nurse navigator should you have any questions/concerns: Rockwell Alexandria, Cardiac Imaging Nurse Navigator Larey Brick, Cardiac Imaging Nurse Navigator [facility] Direct Office Dial: [phone]   For scheduling needs, including cancellations and rescheduling, please call [place], [phone].   Follow-Up: At [facility], you and your health needs are our priority.  As part of our continuing mission to provide you with exceptional heart care, we have created designated Provider Care Teams.  These Care Teams include your primary Cardiologist (physician) and Advanced Practice Providers (APPs -  Physician Assistants and Nurse Practitioners) who all work together to provide you with the care you need, when you need it.  We recommend signing up for the patient portal called "MyChart".  Sign up information is provided on this After Visit Summary.  MyChart is used to connect with patients for Virtual Visits (Telemedicine).  Patients are able to view lab/test results, encounter notes, upcoming appointments, etc.  Non-urgent messages can be sent to your provider as well.   To learn more about what you can do with MyChart, go to [email or web address].    Your next appointment:   Follow up will be based on the results of the above testing.   Important Information About Sugar         Signed, Donato Schultz, MD  06/25/2022 2:48 PM    [facility]

## 2022-06-25 NOTE — Patient Instructions (Signed)
Medication Instructions:  The current medical regimen is effective;  continue present plan and medications.  *If you need a refill on your cardiac medications before your next appointment, please call your pharmacy*   Lab Work: Please have blood work today (BMP)  If you have labs (blood work) drawn today and your tests are completely normal, you will receive your results only by: MyChart Message (if you have MyChart) OR A paper copy in the mail If you have any lab test that is abnormal or we need to change your treatment, we will call you to review the results.   Testing/Procedures: Your physician has requested that you have an echocardiogram. Echocardiography is a painless test that uses sound waves to create images of your heart. It provides your doctor with information about the size and shape of your heart and how well your heart's chambers and valves are working. This procedure takes approximately one hour. There are no restrictions for this procedure.    Your cardiac CT will be scheduled at:   [facility] [address] [place], [place] [address] [phone]  Please arrive at the [facility]'s Entrance (Entrance C2) of [facility] 30 minutes prior to test start time. You can use the FREE valet parking offered at entrance C (encouraged to control the heart rate for the test)  Proceed to the [facility] Radiology Department (first floor) to check-in and test prep.  All radiology patients and guests should use entrance C2 at [facility], accessed from [facility], even though the hospital's physical address listed is [address]     Please follow these instructions carefully (unless otherwise directed):  Hold all erectile dysfunction medications at least 3 days (72 hrs) prior to test. (Ie viagra, cialis, sildenafil, tadalafil, etc) We will administer nitroglycerin during this exam.   On the Night Before the  Test: Be sure to Drink plenty of water. Do not consume any caffeinated/decaffeinated beverages or chocolate 12 hours prior to your test. Do not take any antihistamines 12 hours prior to your test.  On the Day of the Test: Drink plenty of water until 1 hour prior to the test. You may take your regular medications prior to the test.  Take metoprolol (Lopressor) two hours prior to test. HOLD Furosemide/Hydrochlorothiazide morning of the test.  After the Test: Drink plenty of water. After receiving IV contrast, you may experience a mild flushed feeling. This is normal. On occasion, you may experience a mild rash up to 24 hours after the test. This is not dangerous. If this occurs, you can take Benadryl 25 mg and increase your fluid intake. If you experience trouble breathing, this can be serious. If it is severe call 911 IMMEDIATELY. If it is mild, please call our office. If you take any of these medications: Glipizide/Metformin, Avandament, Glucavance, please do not take 48 hours after completing test unless otherwise instructed.  We will call to schedule your test 2-4 weeks out understanding that some insurance companies will need an authorization prior to the service being performed.   For non-scheduling related questions, please contact the cardiac imaging nurse navigator should you have any questions/concerns: Rockwell Alexandria, Cardiac Imaging Nurse Navigator Larey Brick, Cardiac Imaging Nurse Navigator [facility] Direct Office Dial: [phone]   For scheduling needs, including cancellations and rescheduling, please call [place], [phone].   Follow-Up: At [facility], you and your health needs are our priority.  As part  of our continuing mission to provide you with exceptional heart care, we have created designated Provider Care Teams.  These Care Teams include your primary Cardiologist (physician) and Advanced Practice Providers (APPs -   Physician Assistants and Nurse Practitioners) who all work together to provide you with the care you need, when you need it.  We recommend signing up for the patient portal called "MyChart".  Sign up information is provided on this After Visit Summary.  MyChart is used to connect with patients for Virtual Visits (Telemedicine).  Patients are able to view lab/test results, encounter notes, upcoming appointments, etc.  Non-urgent messages can be sent to your provider as well.   To learn more about what you can do with MyChart, go to [email or web address].    Your next appointment:   Follow up will be based on the results of the above testing.   Important Information About Sugar

## 2022-06-26 LAB — BASIC METABOLIC PANEL
BUN/Creatinine Ratio: 15 (ref 10–24)
BUN: 17 mg/dL (ref 8–27)
CO2: 21 mmol/L (ref 20–29)
Calcium: 9.6 mg/dL (ref 8.6–10.2)
Chloride: 105 mmol/L (ref 96–106)
Creatinine, Ser: 1.16 mg/dL (ref 0.76–1.27)
Glucose: 107 mg/dL — ABNORMAL HIGH (ref 70–99)
Potassium: 4.7 mmol/L (ref 3.5–5.2)
Sodium: 141 mmol/L (ref 134–144)
eGFR: 69 mL/min/{1.73_m2} (ref >59)

## 2022-07-05 ENCOUNTER — Ambulatory Visit (HOSPITAL_COMMUNITY): Payer: 59 | Attending: Cardiology

## 2022-07-05 DIAGNOSIS — R0602 Shortness of breath: Secondary | ICD-10-CM | POA: Diagnosis present

## 2022-07-05 LAB — ECHOCARDIOGRAM COMPLETE
Area-P 1/2: 3.89 cm2
S' Lateral: 2.9 cm

## 2022-07-05 MED ORDER — PERFLUTREN LIPID MICROSPHERE
1.0000 mL | INTRAVENOUS | Status: AC | PRN
  Administered 2022-07-05: 2 mL via INTRAVENOUS

## 2022-07-18 ENCOUNTER — Telehealth (HOSPITAL_COMMUNITY): Payer: Self-pay | Admitting: *Deleted

## 2022-07-18 NOTE — Telephone Encounter (Signed)
Attempted to call patient regarding upcoming cardiac CT appointment. °Left message on voicemail with name and callback number ° °Aser Nylund RN Navigator Cardiac Imaging °[facility] °[phone] Office °[phone] Cell ° °

## 2022-07-19 ENCOUNTER — Ambulatory Visit (HOSPITAL_COMMUNITY)
Admission: RE | Admit: 2022-07-19 | Discharge: 2022-07-19 | Disposition: A | Discharge status: Home / Self Care | Payer: 59 | Source: Ambulatory Visit | Attending: Cardiology | Admitting: Cardiology

## 2022-07-19 DIAGNOSIS — R931 Abnormal findings on diagnostic imaging of heart and coronary circulation: Secondary | ICD-10-CM | POA: Diagnosis present

## 2022-07-19 DIAGNOSIS — I209 Angina pectoris, unspecified: Secondary | ICD-10-CM | POA: Diagnosis present

## 2022-07-19 DIAGNOSIS — R0602 Shortness of breath: Secondary | ICD-10-CM | POA: Diagnosis present

## 2022-07-19 MED ORDER — IOHEXOL 350 MG/ML SOLN
95.0000 mL | Freq: Once | INTRAVENOUS | Status: AC | PRN
  Administered 2022-07-19: 95 mL via INTRAVENOUS

## 2022-07-19 MED ORDER — NITROGLYCERIN 0.4 MG SL SUBL
SUBLINGUAL_TABLET | SUBLINGUAL | Status: AC
  Filled 2022-07-19: qty 2

## 2022-07-19 MED ORDER — NITROGLYCERIN 0.4 MG SL SUBL
0.8000 mg | SUBLINGUAL_TABLET | SUBLINGUAL | Status: DC | PRN
  Administered 2022-07-19: 0.8 mg via SUBLINGUAL

## 2022-07-20 ENCOUNTER — Ambulatory Visit (HOSPITAL_COMMUNITY)
Admission: RE | Admit: 2022-07-20 | Discharge: 2022-07-20 | Disposition: A | Discharge status: Home / Self Care | Payer: 59 | Source: Ambulatory Visit | Attending: Cardiology | Admitting: Cardiology

## 2022-07-20 ENCOUNTER — Ambulatory Visit (HOSPITAL_BASED_OUTPATIENT_CLINIC_OR_DEPARTMENT_OTHER)
Admission: RE | Admit: 2022-07-20 | Discharge: 2022-07-20 | Disposition: A | Discharge status: Home / Self Care | Payer: 59 | Source: Ambulatory Visit | Attending: Cardiology | Admitting: Cardiology

## 2022-07-20 ENCOUNTER — Other Ambulatory Visit (HOSPITAL_COMMUNITY): Payer: Self-pay | Admitting: Emergency Medicine

## 2022-07-20 DIAGNOSIS — R931 Abnormal findings on diagnostic imaging of heart and coronary circulation: Secondary | ICD-10-CM | POA: Diagnosis not present

## 2022-07-23 ENCOUNTER — Telehealth: Payer: Self-pay | Admitting: *Deleted

## 2022-07-23 ENCOUNTER — Telehealth: Payer: Self-pay | Admitting: Cardiology

## 2022-07-23 DIAGNOSIS — I2510 Atherosclerotic heart disease of native coronary artery without angina pectoris: Secondary | ICD-10-CM

## 2022-07-23 DIAGNOSIS — Z79899 Other long term (current) drug therapy: Secondary | ICD-10-CM

## 2022-07-23 MED ORDER — ROSUVASTATIN CALCIUM 20 MG PO TABS: 20.0000 mg | ORAL_TABLET | Freq: Every day | ORAL | 3 refills | Status: DC

## 2022-07-23 NOTE — Telephone Encounter (Signed)
Office visit note and coronary CT results faxed to Dr Rachell Cipro, MD as requested.

## 2022-07-23 NOTE — Telephone Encounter (Signed)
Office calling to get patient notes from his appt on 06/25/22. Please advise

## 2022-07-23 NOTE — Telephone Encounter (Signed)
May proceed with surgery.   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  After surgery would recommend aspirin 81mg  once a day  Would change lovastatin 40mg  over to Crestor 20mg  once a day for further plaque stabilization and check a lipid panel in 2 months.   APP follow up in 6 months.   Candee Furbish, MD   Pt is aware of the above information.  RX sent into pharmacy of choice, lipid ordered and scheduled.

## 2022-07-30 ENCOUNTER — Ambulatory Visit: Payer: Self-pay | Admitting: Surgery

## 2022-09-03 ENCOUNTER — Other Ambulatory Visit: Payer: Self-pay

## 2022-09-03 ENCOUNTER — Encounter (HOSPITAL_BASED_OUTPATIENT_CLINIC_OR_DEPARTMENT_OTHER): Payer: Self-pay | Admitting: Surgery

## 2022-09-04 ENCOUNTER — Encounter (HOSPITAL_BASED_OUTPATIENT_CLINIC_OR_DEPARTMENT_OTHER)
Admission: RE | Admit: 2022-09-04 | Discharge: 2022-09-04 | Disposition: A | Discharge status: Home / Self Care | Payer: Managed Care, Other (non HMO) | Source: Ambulatory Visit | Attending: Surgery | Admitting: Surgery

## 2022-09-04 ENCOUNTER — Other Ambulatory Visit (HOSPITAL_BASED_OUTPATIENT_CLINIC_OR_DEPARTMENT_OTHER): Payer: PRIVATE HEALTH INSURANCE

## 2022-09-04 DIAGNOSIS — Z01812 Encounter for preprocedural laboratory examination: Secondary | ICD-10-CM | POA: Diagnosis present

## 2022-09-04 LAB — BASIC METABOLIC PANEL
Anion gap: 10 (ref 5–15)
BUN: 16 mg/dL (ref 8–23)
CO2: 22 mmol/L (ref 22–32)
Calcium: 9.1 mg/dL (ref 8.9–10.3)
Chloride: 104 mmol/L (ref 98–111)
Creatinine, Ser: 1.3 mg/dL — ABNORMAL HIGH (ref 0.61–1.24)
GFR, Estimated: 60 mL/min — ABNORMAL LOW (ref >60)
Glucose, Bld: 225 mg/dL — ABNORMAL HIGH (ref 70–99)
Potassium: 4.3 mmol/L (ref 3.5–5.1)
Sodium: 136 mmol/L (ref 135–145)

## 2022-09-04 NOTE — Progress Notes (Signed)

## 2022-09-10 ENCOUNTER — Encounter (HOSPITAL_BASED_OUTPATIENT_CLINIC_OR_DEPARTMENT_OTHER): Payer: Self-pay | Admitting: Surgery

## 2022-09-10 NOTE — Anesthesia Preprocedure Evaluation (Signed)
Anesthesia Evaluation  Patient identified by MRN, date of birth, ID band Patient awake    Reviewed: Allergy & Precautions, NPO status , Patient's Chart, lab work & pertinent test results, reviewed documented beta blocker date and time   Airway Mallampati: II  TM Distance: >3 FB Neck ROM: Full    Dental no notable dental hx. (+) Teeth Intact, Caps, Dental Advisory Given   Pulmonary Patient abstained from smoking., former smoker   Pulmonary exam normal breath sounds clear to auscultation       Cardiovascular hypertension, Pt. on medications and Pt. on home beta blockers Normal cardiovascular exam Rhythm:Regular Rate:Normal  Echo 07/05/22  1. Left ventricular ejection fraction, by estimation, is 60 to 65%. The  left ventricle has normal function. The left ventricle has no regional  wall motion abnormalities. Left ventricular diastolic parameters are  consistent with Grade I diastolic dysfunction (impaired relaxation).   2. Right ventricular systolic function is normal. The right ventricular  size is normal. Tricuspid regurgitation signal is inadequate for assessing  PA pressure.   3. The mitral valve is normal in structure. No evidence of mitral valve  regurgitation. No evidence of mitral stenosis.   4. The aortic valve is tricuspid. Aortic valve regurgitation is not  visualized. No aortic stenosis is present.   5. The inferior vena cava is dilated in size with >50% respiratory  variability, suggesting right atrial pressure of 8 mmHg.   EKG  NSR, inverted T wave in V6    Neuro/Psych negative neurological ROS  negative psych ROS   GI/Hepatic Neg liver ROS,GERD  Medicated,,  Endo/Other  diabetes, Well Controlled, Type 2, Insulin Dependent    Renal/GU negative Renal ROS  negative genitourinary   Musculoskeletal Right Inguinal hernia   Abdominal  (+) + obese  Peds  Hematology negative hematology ROS (+)   Anesthesia  Other Findings   Reproductive/Obstetrics                             Anesthesia Physical Anesthesia Plan  ASA: 2  Anesthesia Plan: General   Post-op Pain Management: Regional block* and Minimal or no pain anticipated   Induction: Intravenous  PONV Risk Score and Plan: 4 or greater and Treatment may vary due to age or medical condition and Ondansetron  Airway Management Planned: Oral ETT and LMA  Additional Equipment: None  Intra-op Plan:   Post-operative Plan: Extubation in OR  Informed Consent: I have reviewed the patients History and Physical, chart, labs and discussed the procedure including the risks, benefits and alternatives for the proposed anesthesia with the patient or authorized representative who has indicated his/her understanding and acceptance.     Dental advisory given  Plan Discussed with: CRNA and Anesthesiologist  Anesthesia Plan Comments:         Anesthesia Quick Evaluation

## 2022-09-11 ENCOUNTER — Ambulatory Visit (HOSPITAL_BASED_OUTPATIENT_CLINIC_OR_DEPARTMENT_OTHER): Payer: 59 | Admitting: Anesthesiology

## 2022-09-11 ENCOUNTER — Encounter (HOSPITAL_BASED_OUTPATIENT_CLINIC_OR_DEPARTMENT_OTHER): Payer: Self-pay | Admitting: Surgery

## 2022-09-11 ENCOUNTER — Encounter (HOSPITAL_BASED_OUTPATIENT_CLINIC_OR_DEPARTMENT_OTHER)
Admission: RE | Disposition: A | Discharge status: Home / Self Care | Payer: Self-pay | Source: Home / Self Care | Attending: Surgery

## 2022-09-11 ENCOUNTER — Ambulatory Visit (HOSPITAL_BASED_OUTPATIENT_CLINIC_OR_DEPARTMENT_OTHER)
Admission: RE | Admit: 2022-09-11 | Discharge: 2022-09-11 | Disposition: A | Discharge status: Home / Self Care | Payer: 59 | Attending: Surgery | Admitting: Surgery

## 2022-09-11 ENCOUNTER — Other Ambulatory Visit: Payer: Self-pay

## 2022-09-11 DIAGNOSIS — K4090 Unilateral inguinal hernia, without obstruction or gangrene, not specified as recurrent: Secondary | ICD-10-CM

## 2022-09-11 DIAGNOSIS — Z794 Long term (current) use of insulin: Secondary | ICD-10-CM | POA: Insufficient documentation

## 2022-09-11 DIAGNOSIS — Z87891 Personal history of nicotine dependence: Secondary | ICD-10-CM | POA: Insufficient documentation

## 2022-09-11 DIAGNOSIS — I10 Essential (primary) hypertension: Secondary | ICD-10-CM | POA: Insufficient documentation

## 2022-09-11 DIAGNOSIS — K219 Gastro-esophageal reflux disease without esophagitis: Secondary | ICD-10-CM | POA: Diagnosis not present

## 2022-09-11 DIAGNOSIS — E119 Type 2 diabetes mellitus without complications: Secondary | ICD-10-CM | POA: Insufficient documentation

## 2022-09-11 DIAGNOSIS — Z6833 Body mass index (BMI) 33.0-33.9, adult: Secondary | ICD-10-CM | POA: Insufficient documentation

## 2022-09-11 DIAGNOSIS — E669 Obesity, unspecified: Secondary | ICD-10-CM | POA: Insufficient documentation

## 2022-09-11 HISTORY — PX: INGUINAL HERNIA REPAIR: SHX194

## 2022-09-11 LAB — GLUCOSE, CAPILLARY
Glucose-Capillary: 112 mg/dL — ABNORMAL HIGH (ref 70–99)
Glucose-Capillary: 147 mg/dL — ABNORMAL HIGH (ref 70–99)

## 2022-09-11 SURGERY — REPAIR, HERNIA, INGUINAL, ADULT
Anesthesia: General | Laterality: Right

## 2022-09-11 MED ORDER — LIDOCAINE HCL (CARDIAC) PF 100 MG/5ML IV SOSY
PREFILLED_SYRINGE | INTRAVENOUS | Status: DC | PRN
  Administered 2022-09-11: 60 mg via INTRAVENOUS

## 2022-09-11 MED ORDER — FENTANYL CITRATE (PF) 100 MCG/2ML IJ SOLN
50.0000 ug | Freq: Once | INTRAMUSCULAR | Status: AC
  Administered 2022-09-11: 50 ug via INTRAVENOUS

## 2022-09-11 MED ORDER — SUGAMMADEX SODIUM 500 MG/5ML IV SOLN
INTRAVENOUS | Status: AC
  Filled 2022-09-11: qty 5

## 2022-09-11 MED ORDER — PROPOFOL 10 MG/ML IV BOLUS
INTRAVENOUS | Status: AC
  Filled 2022-09-11: qty 20

## 2022-09-11 MED ORDER — EPHEDRINE SULFATE (PRESSORS) 50 MG/ML IJ SOLN
INTRAMUSCULAR | Status: DC | PRN
  Administered 2022-09-11: 10 mg via INTRAVENOUS
  Administered 2022-09-11: 15 mg via INTRAVENOUS

## 2022-09-11 MED ORDER — ONDANSETRON HCL 4 MG/2ML IJ SOLN
INTRAMUSCULAR | Status: DC | PRN
  Administered 2022-09-11: 4 mg via INTRAVENOUS

## 2022-09-11 MED ORDER — OXYCODONE HCL 5 MG PO TABS
5.0000 mg | ORAL_TABLET | Freq: Once | ORAL | Status: AC | PRN
  Administered 2022-09-11: 5 mg via ORAL

## 2022-09-11 MED ORDER — OXYCODONE HCL 5 MG PO TABS
ORAL_TABLET | ORAL | Status: AC
  Filled 2022-09-11: qty 1

## 2022-09-11 MED ORDER — OXYCODONE HCL 5 MG PO TABS: 5.0000 mg | ORAL_TABLET | Freq: Four times a day (QID) | ORAL | 0 refills | Status: DC | PRN

## 2022-09-11 MED ORDER — FENTANYL CITRATE (PF) 100 MCG/2ML IJ SOLN
INTRAMUSCULAR | Status: AC
  Filled 2022-09-11: qty 2

## 2022-09-11 MED ORDER — CEFAZOLIN SODIUM-DEXTROSE 2-3 GM-%(50ML) IV SOLR
INTRAVENOUS | Status: DC | PRN
  Administered 2022-09-11: 2 g via INTRAVENOUS

## 2022-09-11 MED ORDER — DEXAMETHASONE SODIUM PHOSPHATE 10 MG/ML IJ SOLN
INTRAMUSCULAR | Status: DC | PRN
  Administered 2022-09-11: 10 mg via INTRAVENOUS
  Administered 2022-09-11: 5 mg via INTRAVENOUS

## 2022-09-11 MED ORDER — MIDAZOLAM HCL 2 MG/2ML IJ SOLN
INTRAMUSCULAR | Status: AC
  Filled 2022-09-11: qty 2

## 2022-09-11 MED ORDER — BUPIVACAINE LIPOSOME 1.3 % IJ SUSP: INTRAMUSCULAR | Status: DC | PRN

## 2022-09-11 MED ORDER — CHLORHEXIDINE GLUCONATE CLOTH 2 % EX PADS: 6.0000 | MEDICATED_PAD | Freq: Once | CUTANEOUS | Status: DC

## 2022-09-11 MED ORDER — LACTATED RINGERS IV SOLN: INTRAVENOUS | Status: DC

## 2022-09-11 MED ORDER — BUPIVACAINE-EPINEPHRINE (PF) 0.25% -1:200000 IJ SOLN
INTRAMUSCULAR | Status: AC
  Filled 2022-09-11: qty 60

## 2022-09-11 MED ORDER — OXYCODONE HCL 5 MG/5ML PO SOLN: 5.0000 mg | Freq: Once | ORAL | Status: AC | PRN

## 2022-09-11 MED ORDER — MIDAZOLAM HCL 2 MG/2ML IJ SOLN
1.0000 mg | Freq: Once | INTRAMUSCULAR | Status: AC
  Administered 2022-09-11: 1 mg via INTRAVENOUS

## 2022-09-11 MED ORDER — ROCURONIUM BROMIDE 10 MG/ML (PF) SYRINGE
PREFILLED_SYRINGE | INTRAVENOUS | Status: AC
  Filled 2022-09-11: qty 10

## 2022-09-11 MED ORDER — ONDANSETRON HCL 4 MG/2ML IJ SOLN: 4.0000 mg | Freq: Once | INTRAMUSCULAR | Status: DC | PRN

## 2022-09-11 MED ORDER — ONDANSETRON HCL 4 MG/2ML IJ SOLN
INTRAMUSCULAR | Status: AC
  Filled 2022-09-11: qty 2

## 2022-09-11 MED ORDER — PROPOFOL 10 MG/ML IV BOLUS
INTRAVENOUS | Status: DC | PRN
  Administered 2022-09-11: 130 mg via INTRAVENOUS

## 2022-09-11 MED ORDER — FENTANYL CITRATE (PF) 100 MCG/2ML IJ SOLN
INTRAMUSCULAR | Status: DC | PRN
  Administered 2022-09-11: 25 ug via INTRAVENOUS
  Administered 2022-09-11: 50 ug via INTRAVENOUS
  Administered 2022-09-11: 25 ug via INTRAVENOUS

## 2022-09-11 MED ORDER — CEFAZOLIN SODIUM-DEXTROSE 2-4 GM/100ML-% IV SOLN: 2.0000 g | INTRAVENOUS | Status: DC

## 2022-09-11 MED ORDER — CEFAZOLIN SODIUM-DEXTROSE 2-4 GM/100ML-% IV SOLN
INTRAVENOUS | Status: AC
  Filled 2022-09-11: qty 100

## 2022-09-11 MED ORDER — LACTATED RINGERS IV SOLN: INTRAVENOUS | Status: DC | PRN

## 2022-09-11 MED ORDER — METHOCARBAMOL 750 MG PO TABS: 750.0000 mg | ORAL_TABLET | Freq: Four times a day (QID) | ORAL | 1 refills | Status: DC | PRN

## 2022-09-11 MED ORDER — LIDOCAINE 2% (20 MG/ML) 5 ML SYRINGE
INTRAMUSCULAR | Status: AC
  Filled 2022-09-11: qty 5

## 2022-09-11 MED ORDER — PHENYLEPHRINE HCL (PRESSORS) 10 MG/ML IV SOLN
INTRAVENOUS | Status: DC | PRN
  Administered 2022-09-11: 240 ug via INTRAVENOUS
  Administered 2022-09-11: 80 ug via INTRAVENOUS
  Administered 2022-09-11 (×2): 240 ug via INTRAVENOUS
  Administered 2022-09-11 (×2): 160 ug via INTRAVENOUS

## 2022-09-11 MED ORDER — BUPIVACAINE HCL (PF) 0.5 % IJ SOLN
INTRAMUSCULAR | Status: DC | PRN
  Administered 2022-09-11: 20 mL via PERINEURAL

## 2022-09-11 MED ORDER — FENTANYL CITRATE (PF) 100 MCG/2ML IJ SOLN
25.0000 ug | INTRAMUSCULAR | Status: DC | PRN
  Administered 2022-09-11 (×2): 50 ug via INTRAVENOUS

## 2022-09-11 MED ORDER — DEXAMETHASONE SODIUM PHOSPHATE 10 MG/ML IJ SOLN
INTRAMUSCULAR | Status: AC
  Filled 2022-09-11: qty 1

## 2022-09-11 MED ORDER — BUPIVACAINE-EPINEPHRINE 0.25% -1:200000 IJ SOLN
INTRAMUSCULAR | Status: DC | PRN
  Administered 2022-09-11: 10 mL

## 2022-09-11 MED ORDER — CEFAZOLIN IN SODIUM CHLORIDE 3-0.9 GM/100ML-% IV SOLN: 3.0000 g | INTRAVENOUS | Status: DC

## 2022-09-11 MED ORDER — BUPIVACAINE LIPOSOME 1.3 % IJ SUSP
INTRAMUSCULAR | Status: DC | PRN
  Administered 2022-09-11: 10 mL via PERINEURAL

## 2022-09-11 SURGICAL SUPPLY — 49 items
ADH SKN CLS APL DERMABOND .7 (GAUZE/BANDAGES/DRESSINGS) ×1
APL PRP STRL LF DISP 70% ISPRP (MISCELLANEOUS) ×1
BLADE CLIPPER SURG (BLADE) IMPLANT
BLADE SURG 15 STRL LF DISP TIS (BLADE) ×1 IMPLANT
BLADE SURG 15 STRL SS (BLADE) ×1
CANISTER SUCT 1200ML W/VALVE (MISCELLANEOUS) IMPLANT
CHLORAPREP W/TINT 26 (MISCELLANEOUS) ×1 IMPLANT
COVER BACK TABLE 60X90IN (DRAPES) ×1 IMPLANT
COVER MAYO STAND STRL (DRAPES) ×1 IMPLANT
DERMABOND ADVANCED .7 DNX12 (GAUZE/BANDAGES/DRESSINGS) ×1 IMPLANT
DRAIN PENROSE .5X12 LATEX STL (DRAIN) ×1 IMPLANT
DRAPE LAPAROTOMY TRNSV 102X78 (DRAPES) ×1 IMPLANT
DRAPE UTILITY XL STRL (DRAPES) ×1 IMPLANT
ELECT COATED BLADE 2.86 ST (ELECTRODE) ×1 IMPLANT
ELECT REM PT RETURN 9FT ADLT (ELECTROSURGICAL) ×1
ELECTRODE REM PT RTRN 9FT ADLT (ELECTROSURGICAL) ×1 IMPLANT
GAUZE 4X4 16PLY ~~LOC~~+RFID DBL (SPONGE) IMPLANT
GAUZE SPONGE 4X4 12PLY STRL LF (GAUZE/BANDAGES/DRESSINGS) IMPLANT
GLOVE BIOGEL PI IND STRL 8 (GLOVE) ×1 IMPLANT
GLOVE ECLIPSE 8.0 STRL XLNG CF (GLOVE) ×1 IMPLANT
GOWN STRL REUS W/ TWL LRG LVL3 (GOWN DISPOSABLE) ×2 IMPLANT
GOWN STRL REUS W/ TWL XL LVL3 (GOWN DISPOSABLE) ×1 IMPLANT
GOWN STRL REUS W/TWL LRG LVL3 (GOWN DISPOSABLE) ×2
GOWN STRL REUS W/TWL XL LVL3 (GOWN DISPOSABLE) ×1
MESH HERNIA SYS ULTRAPRO LRG (Mesh General) IMPLANT
NDL HYPO 25X1 1.5 SAFETY (NEEDLE) ×1 IMPLANT
NEEDLE HYPO 25X1 1.5 SAFETY (NEEDLE) ×1 IMPLANT
NS IRRIG 1000ML POUR BTL (IV SOLUTION) IMPLANT
PACK BASIN DAY SURGERY FS (CUSTOM PROCEDURE TRAY) ×1 IMPLANT
PENCIL SMOKE EVACUATOR (MISCELLANEOUS) ×1 IMPLANT
SLEEVE SCD COMPRESS KNEE MED (STOCKING) ×1 IMPLANT
SPIKE FLUID TRANSFER (MISCELLANEOUS) IMPLANT
SPONGE T-LAP 4X18 ~~LOC~~+RFID (SPONGE) ×1 IMPLANT
STRIP CLOSURE SKIN 1/2X4 (GAUZE/BANDAGES/DRESSINGS) IMPLANT
SUT MON AB 4-0 PC3 18 (SUTURE) ×1 IMPLANT
SUT NOVA 0 T19/GS 22DT (SUTURE) IMPLANT
SUT NOVA NAB DX-16 0-1 5-0 T12 (SUTURE) ×2 IMPLANT
SUT VIC AB 0 SH 27 (SUTURE) IMPLANT
SUT VIC AB 2-0 SH 27 (SUTURE) ×1
SUT VIC AB 2-0 SH 27XBRD (SUTURE) ×1 IMPLANT
SUT VIC AB 3-0 54X BRD REEL (SUTURE) IMPLANT
SUT VIC AB 3-0 BRD 54 (SUTURE)
SUT VICRYL 3-0 CR8 SH (SUTURE) ×1 IMPLANT
SUT VICRYL AB 2 0 TIE (SUTURE) IMPLANT
SUT VICRYL AB 2 0 TIES (SUTURE)
SYR CONTROL 10ML LL (SYRINGE) ×1 IMPLANT
TOWEL GREEN STERILE FF (TOWEL DISPOSABLE) ×1 IMPLANT
TUBE CONNECTING 20X1/4 (TUBING) IMPLANT
YANKAUER SUCT BULB TIP NO VENT (SUCTIONS) IMPLANT

## 2022-09-11 NOTE — Anesthesia Postprocedure Evaluation (Signed)
Anesthesia Post Note  Patient: Travis Hamilton  Procedure(s) Performed: RIGHT INGUINAL HERNIA REPAIR WITH MESH (Right)     Patient location during evaluation: PACU Anesthesia Type: General Level of consciousness: awake and alert and oriented Pain management: pain level controlled Vital Signs Assessment: post-procedure vital signs reviewed and stable Respiratory status: spontaneous breathing, nonlabored ventilation and respiratory function stable Cardiovascular status: blood pressure returned to baseline and stable Postop Assessment: no apparent nausea or vomiting Anesthetic complications: no   No notable events documented.  Last Vitals:  Vitals:   09/11/22 0940 09/11/22 0945  BP:  (!) 141/90  Pulse:  98  Resp:  15  Temp:    SpO2: 98% 95%    Last Pain:  Vitals:   09/11/22 0945  TempSrc:   PainSc: 4                  Leitha Hyppolite A.

## 2022-09-11 NOTE — Op Note (Signed)
Preoperative diagnosis: Right inguinal hernia reducible initial  Postoperative diagnosis: Right inguinal hernia reducible initial indirect  Procedure: Repair of right inguinal hernia with mesh  Surgeon: Erroll Luna, MD  Assistant: Dr. Clois Dupes MD  Anesthesia: LMA with 0.25% Marcaine with epinephrine and Exparel tap block per anesthesia  EBL: 10 CC  Specimen: None  IV fluids: Per anesthesia record  Indications for procedure: Patient is a pleasant 66 year old male seen for right inguinal hernia its become symptomatic.  He presents today for repair after reviewing his surgical options as well as nonoperative management strategies.  Risks and benefits of surgery reviewed as well as use of mesh.  Techniques of repair were also reviewed as well.The risk of hernia repair include bleeding,  Infection,   Recurrence of the hernia,  Mesh use, chronic pain,  Organ injury,  Bowel injury,  Bladder injury,   nerve injury with numbness around the incision,  Death,  and worsening of preexisting  medical problems.  The alternatives to surgery have been discussed as well..  Long term expectations of both operative and non operative treatments have been discussed.   The patient agrees to proceed.    Description of procedure: The patient was met in the holding area and questions were answered.  The right side was marked as correct and a tap block was placed.  All questions were answered.  Right side was marked as correct site.  He was then taken back to the operative room.  He was placed upon the OR table.  After induction of LMA anesthesia, the right groin was prepped and draped in sterile fashion timeout performed.  Proper patient, site and procedure verified.  Oblique incision made in the right inguinal crease.  Dissection was carried down to the level of Scarpa's fascia and this was opened.  Small vessels were controlled with cautery.  The aponeurosis of the external bleak was identified.  Incision was  made.  This incision was slightly medial and was in the rectus sheath.  We then extended that more lateral to open up the external bleak through the inguinal ring.  This exposed the cord structures.  A large indirect defect noted as well.  We then carefully dissected the indirect defect away from the cord structures.  The ilioinguinal nerve was identified and divided as asked the muscle.  This is to prevent postoperative entrapment and neuralgia.  Once the hernia defect was reduced a large piece of the ultra pro hernia system was used with the inner leaflet placed into the pleural space and deployed.  The onlay was placed onto the floor the inguinal canal.  It was secured to the shelving edge of the inguinal ligament, pubic tubercle and the internal oblique with 0 Novafil pop-off sutures.  The opening to the aponeurosis of the rectus muscle was closed with 2-0 Vicryl.  Once the mesh was laid it was examined and felt to cover the defect without any undue tension.  The mesh was secured around the cord structures using 0 Novafil pop-off.  There is ample room for the cord exit.  There is no evidence of any ongoing bleeding.  We then closed the aponeurosis of the external bleak with a running 2-0 Vicryl.  3-0 Vicryl was used to approximate Scarpa's fascia and 4-0 Monocryl was used to close the skin in a subcuticular fashion.  Dermabond was applied.  All counts were found to be correct.  The patient was awoke extubated taken to recovery in satisfactory condition.

## 2022-09-11 NOTE — H&P (Signed)
History of Present Illness: Travis Hamilton is a 66 y.o. male who is seen today for follow-up of right inguinal hernia. He was scheduled for surgery in December but 1 to move it up due to more pain. He has good days and bad days but the symptoms are becoming more bothersome especially at work and lifting. He wears a truss which helps. No nausea vomiting or difficulty urinating..    Review of Systems: A complete review of systems was obtained from the patient. I have reviewed this information and discussed as appropriate with the patient. See HPI as well for other ROS.    Medical History: Past Medical History:  Diagnosis Date  Arthritis  Diabetes mellitus without complication (CMS-HCC)  Hypertension  Sleep apnea   There is no problem list on file for this patient.  Past Surgical History:  Procedure Laterality Date  carpel tunnel right hand  HERNIA REPAIR    No Known Allergies  Current Outpatient Medications on File Prior to Visit  Medication Sig Dispense Refill  aspirin-acetaminophen-caffeine (EXCEDRIN MIGRAINE) 250-250-65 mg per tablet Take 1 tablet by mouth daily with breakfast  famotidine (PEPCID) 40 MG tablet TAKE 1 TABLET BY MOUTH ONCE DAILY FOR HEARTBURN  glucosamine su 2KCl-chondroit 500-400 mg Tab Take by mouth  lisinopriL (ZESTRIL) 40 MG tablet Take 80 mg by mouth once daily  lovastatin (MEVACOR) 40 MG tablet Take 40 mg by mouth at bedtime  SYNJARDY XR 5-1,000 mg XR 24 hr biphasic tablet Take 2 tablets by mouth daily with breakfast  TOUJEO SOLOSTAR U-300 INSULIN pen injector (concentration 300 units/mL) Inject 18 Units subcutaneously once daily   No current facility-administered medications on file prior to visit.   History reviewed. No pertinent family history.   Social History   Tobacco Use  Smoking Status Former  Types: Cigarettes  Quit date: 2008  Years since quitting: 15.8  Smokeless Tobacco Never    Social History   Socioeconomic History  Marital  status: Married  Tobacco Use  Smoking status: Former  Types: Cigarettes  Quit date: 2008  Years since quitting: 15.8  Smokeless tobacco: Never  Substance and Sexual Activity  Alcohol use: Never  Drug use: Never   Objective:   There were no vitals filed for this visit.  There is no height or weight on file to calculate BMI.  Physical Exam Eyes:  General: No scleral icterus. Cardiovascular:  Rate and Rhythm: Normal rate.  Abdominal:  Comments: Right inguinal hernia partially reducible. No redness. No change from previous examination. No evidence of left inguinal hernia.  Musculoskeletal:  General: Normal range of motion.  Cervical back: Normal range of motion.  Neurological:  Mental Status: He is alert.     Assessment and Plan:   Diagnoses and all orders for this visit:  Right inguinal hernia    Patient was to proceed with surgery earlier. He is about the same but is becoming more difficult to lift at work. We will go ahead and move his surgery date. Risks and benefits reviewed and he had no further questions today.  No follow-ups on file.  Hayden Rasmussen, MD

## 2022-09-11 NOTE — Anesthesia Procedure Notes (Signed)
Anesthesia Regional Block: TAP block   Pre-Anesthetic Checklist: , timeout performed,  Correct Patient, Correct Site, Correct Laterality,  Correct Procedure, Correct Position, site marked,  Risks and benefits discussed,  Surgical consent,  Pre-op evaluation,  At surgeon's request and post-op pain management  Laterality: Right  Prep: chloraprep       Needles:  Injection technique: Single-shot  Needle Type: Echogenic Stimulator Needle     Needle Length: 10cm  Needle Gauge: 21   Needle insertion depth: 7 cm   Additional Needles:   Procedures:,,,, ultrasound used (permanent image in chart),,    Narrative:  Start time: 09/11/2022 7:15 AM End time: 09/11/2022 7:20 AM Injection made incrementally with aspirations every 5 mL.  Performed by: Personally  Anesthesiologist: Mal Amabile, MD  Additional Notes: Timeout performed. Patient sedated. Relevant anatomy ID'd using [place]. Incremental 2-44ml injection of LA with frequent aspiration. Patient tolerated procedure well.

## 2022-09-11 NOTE — Interval H&P Note (Signed)
History and Physical Interval Note:  09/11/2022 7:20 AM  Travis Hamilton  has presented today for surgery, with the diagnosis of RIGHT INGUINAL HERNIA.  The various methods of treatment have been discussed with the patient and family. After consideration of risks, benefits and other options for treatment, the patient has consented to  Procedure(s) with comments: RIGHT INGUINAL HERNIA REPAIR WITH MESH (Right) - TAP BLOCK as a surgical intervention.  The patient's history has been reviewed, patient examined, no change in status, stable for surgery.  I have reviewed the patient's chart and labs.  Questions were answered to the patient's satisfaction.    The risk of hernia repair include bleeding,  Infection,   Recurrence of the hernia,  Mesh use, chronic pain,  Organ injury,  Bowel injury,  Bladder injury,   nerve injury with numbness around the incision,  Death,  and worsening of preexisting  medical problems.  The alternatives to surgery have been discussed as well..  Long term expectations of both operative and non operative treatments have been discussed.   The patient agrees to proceed.  Cayleen Benjamin A Tekelia Kareem

## 2022-09-11 NOTE — Discharge Instructions (Addendum)
[facility] _______[facility]  UMBILICAL OR INGUINAL HERNIA REPAIR: POST OP INSTRUCTIONS  Always review your discharge instruction sheet given to you by the facility where your surgery was performed. IF YOU HAVE DISABILITY OR FAMILY LEAVE FORMS, YOU MUST BRING THEM TO THE OFFICE FOR PROCESSING.   DO NOT GIVE THEM TO YOUR DOCTOR.  1. A  prescription for pain medication may be given to you upon discharge.  Take your pain medication as prescribed, if needed.  If narcotic pain medicine is not needed, then you may take acetaminophen (Tylenol) or ibuprofen (Advil) as needed. 2. Take your usually prescribed medications unless otherwise directed. If you need a refill on your pain medication, please contact your pharmacy.  They will contact our office to request authorization. Prescriptions will not be filled after 5 pm or on week-ends. 3. You should follow a light diet the first 24 hours after arrival home, such as soup and crackers, etc.  Be sure to include lots of fluids daily.  Resume your normal diet the day after surgery. 4.Most patients will experience some swelling and bruising around the umbilicus or in the groin and scrotum.  Ice packs and reclining will help.  Swelling and bruising can take several days to resolve.  6. It is common to experience some constipation if taking pain medication after surgery.  Increasing fluid intake and taking a stool softener (such as Colace) will usually help or prevent this problem from occurring.  A mild laxative (Milk of Magnesia or Miralax) should be taken according to package directions if there are no bowel movements after 48 hours. 7. Unless discharge instructions indicate otherwise, you may remove your bandages 24-48 hours after surgery, and you may shower at that time.  You may have steri-strips (small skin tapes) in place directly over the incision.  These strips should be left on the skin for 7-10 days.  If your surgeon used skin glue on the  incision, you may shower in 24 hours.  The glue will flake off over the next 2-3 weeks.  Any sutures or staples will be removed at the office during your follow-up visit. 8. ACTIVITIES:  You may resume regular (light) daily activities beginning the next day--such as daily self-care, walking, climbing stairs--gradually increasing activities as tolerated.  You may have sexual intercourse when it is comfortable.  Refrain from any heavy lifting or straining until approved by your doctor.  a.You may drive when you are no longer taking prescription pain medication, you can comfortably wear a seatbelt, and you can safely maneuver your car and apply brakes. b.RETURN TO WORK:   _____________________________________________  9.You should see your doctor in the office for a follow-up appointment approximately 2-3 weeks after your surgery.  Make sure that you call for this appointment within a day or two after you arrive home to insure a convenient appointment time. 10.OTHER INSTRUCTIONS: _________________________    _____________________________________  WHEN TO CALL YOUR DOCTOR: Fever over 101.0 Inability to urinate Nausea and/or vomiting Extreme swelling or bruising Continued bleeding from incision. Increased pain, redness, or drainage from the incision  The clinic staff is available to answer your questions during regular business hours.  Please don't hesitate to call and ask to speak to one of the nurses for clinical concerns.  If you have a medical emergency, go to the nearest emergency room or call 911.  A surgeon from [facility] is always on call at the hospital   [address], [address],   [place], [place]  [address] ?  [address] [address], [place], [place]   [address] [phone] ? [phone] ? FAX [phone] Web site: [email or web address]    Post Anesthesia Home Care Instructions  Activity: Get plenty of rest for the remainder of the day. A responsible  individual must stay with you for 24 hours following the procedure.  For the next 24 hours, DO NOT: -Drive a car -Advertising copywriter -Drink alcoholic beverages -Take any medication unless instructed by your physician -Make any legal decisions or sign important papers.  Meals: Start with liquid foods such as gelatin or soup. Progress to regular foods as tolerated. Avoid greasy, spicy, heavy foods. If nausea and/or vomiting occur, drink only clear liquids until the nausea and/or vomiting subsides. Call your physician if vomiting continues.  Special Instructions/Symptoms: Your throat may feel dry or sore from the anesthesia or the breathing tube placed in your throat during surgery. If this causes discomfort, gargle with warm salt water. The discomfort should disappear within 24 hours.  If you had a scopolamine patch placed behind your ear for the management of post- operative nausea and/or vomiting:  1. The medication in the patch is effective for 72 hours, after which it should be removed.  Wrap patch in a tissue and discard in the trash. Wash hands thoroughly with soap and water. 2. You may remove the patch earlier than 72 hours if you experience unpleasant side effects which may include dry mouth, dizziness or visual disturbances. 3. Avoid touching the patch. Wash your hands with soap and water after contact with the patch.  Information for Discharge Teaching: EXPAREL (bupivacaine liposome injectable suspension)   Your surgeon or anesthesiologist gave you EXPAREL(bupivacaine) to help control your pain after surgery.  EXPAREL is a local anesthetic that provides pain relief by numbing the tissue around the surgical site. EXPAREL is designed to release pain medication over time and can control pain for up to 72 hours. Depending on how you respond to EXPAREL, you may require less pain medication during your recovery.  Possible side effects: Temporary loss of sensation or ability to move in  the area where bupivacaine was injected. Nausea, vomiting, constipation Rarely, numbness and tingling in your mouth or lips, lightheadedness, or anxiety may occur. Call your doctor right away if you think you may be experiencing any of these sensations, or if you have other questions regarding possible side effects.  Follow all other discharge instructions given to you by your surgeon or nurse. Eat a healthy diet and drink plenty of water or other fluids.  If you return to the hospital for any reason within 96 hours following the administration of EXPAREL, it is important for health care providers to know that you have received this anesthetic. A teal colored band has been placed on your arm with the date, time and amount of EXPAREL you have received in order to alert and inform your health care providers. Please leave this armband in place for the full 96 hours following administration, and then you may remove the band.     May have oxycodone after 4PM today

## 2022-09-11 NOTE — Transfer of Care (Signed)
Immediate Anesthesia Transfer of Care Note  Patient: Travis Hamilton  Procedure(s) Performed: RIGHT INGUINAL HERNIA REPAIR WITH MESH (Right)  Patient Location: PACU  Anesthesia Type:General and Regional  Level of Consciousness: drowsy and patient cooperative  Airway & Oxygen Therapy: Patient Spontanous Breathing and Patient connected to face mask oxygen  Post-op Assessment: Report given to RN and Post -op Vital signs reviewed and stable  Post vital signs: Reviewed and stable  Last Vitals:  Vitals Value Taken Time  BP 127/68 09/11/22 0904  Temp    Pulse 98 09/11/22 0905  Resp 16 09/11/22 0905  SpO2 98 % 09/11/22 0905  Vitals shown include unvalidated device data.  Last Pain:  Vitals:   09/11/22 0637  TempSrc: Oral  PainSc: 0-No pain      Patients Stated Pain Goal: 8 (09/11/22 0762)  Complications: No notable events documented.

## 2022-09-11 NOTE — Anesthesia Procedure Notes (Signed)
Procedure Name: LMA Insertion Date/Time: 09/11/2022 9:07 AM  Performed by: Karen Kitchens, CRNAPre-anesthesia Checklist: Patient identified, Emergency Drugs available, Suction available and Patient being monitored Patient Re-evaluated:Patient Re-evaluated prior to induction Oxygen Delivery Method: Circle system utilized Preoxygenation: Pre-oxygenation with 100% oxygen Induction Type: IV induction Ventilation: Mask ventilation without difficulty LMA: LMA inserted LMA Size: 4.0 Number of attempts: 1 Airway Equipment and Method: Bite block Placement Confirmation: positive ETCO2 Tube secured with: Tape Dental Injury: Teeth and Oropharynx as per pre-operative assessment

## 2022-09-11 NOTE — Progress Notes (Signed)
Assisted Dr. Foster with right, transabdominal plane, ultrasound guided block. Side rails up, monitors on throughout procedure. See vital signs in flow sheet. Tolerated Procedure well. 

## 2022-09-12 ENCOUNTER — Encounter (HOSPITAL_BASED_OUTPATIENT_CLINIC_OR_DEPARTMENT_OTHER): Payer: Self-pay | Admitting: Surgery

## 2022-09-19 ENCOUNTER — Ambulatory Visit: Payer: 59 | Attending: Cardiology

## 2022-09-19 DIAGNOSIS — Z79899 Other long term (current) drug therapy: Secondary | ICD-10-CM

## 2022-09-19 DIAGNOSIS — I2510 Atherosclerotic heart disease of native coronary artery without angina pectoris: Secondary | ICD-10-CM

## 2022-09-19 LAB — LIPID PANEL
Chol/HDL Ratio: 4.2 ratio (ref 0.0–5.0)
Cholesterol, Total: 134 mg/dL (ref 100–199)
HDL: 32 mg/dL — ABNORMAL LOW (ref >39)
LDL Chol Calc (NIH): 77 mg/dL (ref 0–99)
Triglycerides: 142 mg/dL (ref 0–149)
VLDL Cholesterol Cal: 25 mg/dL (ref 5–40)

## 2022-09-24 ENCOUNTER — Encounter: Payer: Self-pay | Admitting: *Deleted

## 2022-10-03 ENCOUNTER — Telehealth: Payer: Self-pay | Admitting: Cardiology

## 2022-10-03 DIAGNOSIS — I2510 Atherosclerotic heart disease of native coronary artery without angina pectoris: Secondary | ICD-10-CM

## 2022-10-03 MED ORDER — EZETIMIBE 10 MG PO TABS: 10.0000 mg | ORAL_TABLET | Freq: Every day | ORAL | 3 refills | Status: AC

## 2022-10-03 NOTE — Telephone Encounter (Signed)
The patient has been notified of the result and verbalized understanding.  All questions (if any) were answered. Macie Burows, RN 10/03/2022 12:26 PM   Pt will come in for FLP on 01/06/22.

## 2022-10-03 NOTE — Telephone Encounter (Signed)
Patient calling in regards to letter he received bout his labs. Please advise

## 2022-10-11 NOTE — Progress Notes (Signed)
Cardiology Clinic Note   Patient Name: Travis Hamilton Date of Encounter: 10/12/2022  Primary Care Provider:  Fanny Bien, MD Primary Cardiologist:  Dr. Marlou Porch   Patient Profile    66 year old male with history of paroxysmal atrial tachycardia, hypertension, hyperlipidemia, former tobacco abuse, and Type II diabetes. CT coronary with FFR on 07/20/2022 revealed no significant stenosis of the LM, LAD 0.87 proximal LAD, and 0.71 distal LAD, LCX and RCA with no significant stenosis. By [facility], the mid LAD stenosis appeared to be hemodynamically significant, though the proximal LAD plaque appeared to create a more significant stenosis, however, by FFR, the proximal LAD plaque is not hemodynamically stenosed. He is s/p right inguinal hernia repair on 09/11/2022 by Dr. Erroll Luna on 09/11/2022.   He was seen last by Dr. Marlou Porch on 06/25/2022 for above pre-operative evaluation. He was having symptoms of dyspnea when walking around his house.   Past Medical History    Past Medical History:  Diagnosis Date   Diabetes mellitus without complication (HCC)    GERD (gastroesophageal reflux disease)    Hernia    Hyperlipidemia    Hypertension    PAT (paroxysmal atrial tachycardia)    not had problems in over 15 yrs   Past Surgical History:  Procedure Laterality Date   CARPAL TUNNEL RELEASE Right 11/12/2017   Procedure: RIGHT CARPAL TUNNEL RELEASE;  Surgeon: Daryll Brod, MD;  Location: [place];  Service: Orthopedics;  Laterality: Right;  FAB   HERNIA REPAIR  1989   LOWER LEFT SIDE   INGUINAL HERNIA REPAIR Right 09/11/2022   Procedure: RIGHT INGUINAL HERNIA REPAIR WITH MESH;  Surgeon: Erroll Luna, MD;  Location: [place];  Service: General;  Laterality: Right;  TAP BLOCK   PARTIAL DISC REMOVAL  1982   L-5   TONSILLECTOMY  1960    Allergies  No Known Allergies  History of Present Illness    [name] presents today for ongoing assessment and  management of coronary artery disease, seen on coronary CTA dated 07/05/2022, without hemodynamically significant stenosis by FFR but found to have LAD stenosis mid and distal, hypertension, hyperlipidemia, former tobacco abuse, type 2 diabetes on insulin.  Was last seen by Dr. Marlou Porch on 06/25/2022 with complaints of dyspnea just walking around the house or with minimal exertion.  He has had recent inguinal hernia repair on 09/11/2022.  He comes today for ongoing complaints of dyspnea on exertion.  He states that when he takes a shower he is short of breath and has to sit down and rest when he gets out.  He occasionally has dyspnea on exertion, but this is not consistent.  He states that his shortness of breath is becoming more concerning to him, and he would like further evaluation of this.  He denies chest pain, dizziness, near syncope, or palpitations.  Home Medications    Current Outpatient Medications  Medication Sig Dispense Refill   aspirin-acetaminophen-caffeine (EXCEDRIN MIGRAINE) 250-250-65 MG tablet Take 2 tablets by mouth daily.     ezetimibe (ZETIA) 10 MG tablet Take 1 tablet (10 mg total) by mouth daily. 90 tablet 3   famotidine (PEPCID) 40 MG tablet Take 40 mg by mouth daily.     insulin glargine, 1 Unit Dial, (TOUJEO SOLOSTAR) 300 UNIT/ML Solostar Pen 15 Units.     JARDIANCE 10 MG TABS tablet Take 10 mg by mouth daily.     lisinopril (ZESTRIL) 40 MG tablet Take 80 mg by mouth daily.  methocarbamol (ROBAXIN-750) 750 MG tablet Take 1 tablet (750 mg total) by mouth every 6 (six) hours as needed for muscle spasms. 12 tablet 1   Misc Natural Products (GLUCOSAMINE CHOND CMP TRIPLE PO)      oxyCODONE (OXY IR/ROXICODONE) 5 MG immediate release tablet Take 1 tablet (5 mg total) by mouth every 6 (six) hours as needed for severe pain. 15 tablet 0   rosuvastatin (CRESTOR) 20 MG tablet Take 1 tablet (20 mg total) by mouth daily. 90 tablet 3   SYNJARDY XR 25-1000 MG TB24 Take 1 tablet by mouth  daily.     No current facility-administered medications for this visit.     Family History    Family History  Problem Relation Age of Onset   Arthritis Sister    He indicated that his mother is deceased. He indicated that his father is deceased. He indicated that his sister is alive.  Social History    Social History   Socioeconomic History   Marital status: Married    Spouse name: Not on file   Number of children: Not on file   Years of education: Not on file   Highest education level: Not on file  Occupational History   Not on file  Tobacco Use   Smoking status: Former    Types: Cigarettes    Quit date: 02/13/2007    Years since quitting: 15.6   Smokeless tobacco: Never  Substance and Sexual Activity   Alcohol use: Yes    Comment: ocassionally   Drug use: No   Sexual activity: Not on file  Other Topics Concern   Not on file  Social History Narrative   Not on file   Social Determinants of Health   Financial Resource Strain: Not on file  Food Insecurity: Not on file  Transportation Needs: Not on file  Physical Activity: Not on file  Stress: Not on file  Social Connections: Not on file  Intimate Partner Violence: Not on file     Review of Systems    General:  No chills, fever, night sweats or weight changes.  Cardiovascular:  No chest pain, dyspnea on exertion, edema, orthopnea, palpitations, paroxysmal nocturnal dyspnea. Dermatological: No rash, lesions/masses Respiratory: No cough, positive for chronic dyspnea Urologic: No hematuria, dysuria Abdominal:   No nausea, vomiting, diarrhea, bright red blood per rectum, melena, or hematemesis Neurologic:  No visual changes, wkns, changes in mental status. All other systems reviewed and are otherwise negative except as noted above.     Physical Exam    VS:  BP 132/76   Pulse (!) 111   Ht 5\' 8"  (1.727 m)   Wt 223 lb 6.4 oz (101.3 kg)   SpO2 97%   BMI 33.97 kg/m  , BMI Body mass index is 33.97 kg/m.      GEN: Well nourished, well developed, in no acute distress. HEENT: normal. Neck: Supple, no JVD, carotid bruits, or masses. Cardiac: RRR, tachycardic, no murmurs, rubs, or gallops. No clubbing, cyanosis, edema.  Radials/DP/PT 2+ and equal bilaterally.  Respiratory:  Respirations regular and unlabored, clear to auscultation bilaterally. GI: Soft, nontender, nondistended, BS + x 4.  Central obesity MS: no deformity or atrophy. Skin: warm and dry, no rash. Neuro:  Strength and sensation are intact. Psych: Normal affect.  Accessory Clinical Findings    ECG personally reviewed by me today-sinus tachycardia heart rate 111 bpm possible left atrial enlargement- No acute changes  Lab Results  Component Value Date   WBC 7.3  11/29/2021   HGB 15.5 11/29/2021   HCT 47.2 11/29/2021   MCV 87.9 11/29/2021   PLT 197 11/29/2021   Lab Results  Component Value Date   CREATININE 1.30 (H) 09/04/2022   BUN 16 09/04/2022   NA 136 09/04/2022   K 4.3 09/04/2022   CL 104 09/04/2022   CO2 22 09/04/2022   Lab Results  Component Value Date   ALT 25 10/19/2015   AST 25 10/19/2015   ALKPHOS 48 10/19/2015   BILITOT 0.6 10/19/2015   Lab Results  Component Value Date   CHOL 134 09/19/2022   HDL 32 (L) 09/19/2022   LDLCALC 77 09/19/2022   TRIG 142 09/19/2022   CHOLHDL 4.2 09/19/2022    No results found for: "HGBA1C"  Review of Prior Studies: Coronary CTA with FFR 07/20/2022  1. Left Main: No significant stenosis.   2. LAD: 0.87 proximal LAD, 0.71 distal LAD. 3. LCX: No significant stenosis. 4. RCA: No significant stenosis.   IMPRESSION: 1. by FFR, the mid LAD stenosis appears to be hemodynamically significant though the proximal LAD plaque visually appears to create more significant stenosis. By FFR, the proximal LAD plaque is not hemodynamically stenosis.   Marca Ancona, MD  Echocardiogram 07/05/2022 1. Left ventricular ejection fraction, by estimation, is 60 to 65%. The  left  ventricle has normal function. The left ventricle has no regional  wall motion abnormalities. Left ventricular diastolic parameters are  consistent with Grade I diastolic  dysfunction (impaired relaxation).   2. Right ventricular systolic function is normal. The right ventricular  size is normal. Tricuspid regurgitation signal is inadequate for assessing  PA pressure.   3. The mitral valve is normal in structure. No evidence of mitral valve  regurgitation. No evidence of mitral stenosis.   4. The aortic valve is tricuspid. Aortic valve regurgitation is not  visualized. No aortic stenosis is present.   5. The inferior vena cava is dilated in size with >50% respiratory  variability, suggesting right atrial pressure of 8 mmHg.  Assessment & Plan   1.  Chronic dyspnea: Appears to be more worrisome to him, causing him to have to rest more.  This is intermittent concerning exertion, but has noticed it while taking a shower or doing occasional exertional activities.  He has not been evaluated for OSA, as he states that he does not feel a CPAP would be helpful as he is having trouble exhaling versus inhaling.  He has not had PFTs or any other pulmonary evaluation.  I would recommend that he have PFTs done for further evaluation and possible referral to pulmonology but will defer to primary care provider concerning this, uncertain cardiac in etiology.  2.  CAD: Coronary artery CTA was completed on 07/20/2022 which did not reveal significant stenosis however he did have mid LAD stenosis which was found to be hemodynamically significant by FFR.  However, due to symptoms of chronic dyspnea I will evaluate for other for ischemia causing this issue with a Lexiscan Myoview stress test.  I have explained this test to him and he verbalizes understanding and is willing to proceed.  3.  Hypertension: Excellent control of blood pressure on current medication regimen.  No changes at this time.  4.   Hypercholesterolemia: Now on Zetia 10 mg daily and Crestor 20 mg daily.  PCP repeating labs.     Current medicines are reviewed at length with the patient today.  I have spent 30 min's  dedicated to the care of this  patient on the date of this encounter to include pre-visit review of records, assessment, management and diagnostic testing,with shared decision making.  Signed, Phill Myron. West Pugh, ANP, AACC   10/12/2022 9:56 AM      Office [phone] Fax [phone]  Notice: This dictation was prepared with Dragon dictation along with smaller phrase technology. Any transcriptional errors that result from this process are unintentional and may not be corrected upon review.

## 2022-10-12 ENCOUNTER — Encounter: Payer: Self-pay | Admitting: Adult Health

## 2022-10-12 ENCOUNTER — Ambulatory Visit: Payer: 59 | Attending: Adult Health | Admitting: Adult Health

## 2022-10-12 VITALS — BP 132/76 | HR 111 | Ht 68.0 in | Wt 223.4 lb

## 2022-10-12 DIAGNOSIS — I2583 Coronary atherosclerosis due to lipid rich plaque: Secondary | ICD-10-CM | POA: Diagnosis not present

## 2022-10-12 DIAGNOSIS — I209 Angina pectoris, unspecified: Secondary | ICD-10-CM

## 2022-10-12 DIAGNOSIS — I2510 Atherosclerotic heart disease of native coronary artery without angina pectoris: Secondary | ICD-10-CM | POA: Diagnosis not present

## 2022-10-12 NOTE — Patient Instructions (Signed)
Medication Instructions:  No Changes *If you need a refill on your cardiac medications before your next appointment, please call your pharmacy*   Lab Work: No labs If you have labs (blood work) drawn today and your tests are completely normal, you will receive your results only by: MyChart Message (if you have MyChart) OR A paper copy in the mail If you have any lab test that is abnormal or we need to change your treatment, we will call you to review the results.   Testing/Procedures: [address]. Your physician has requested that you have a lexiscan myoview. For further information please visit [email or web address] Please follow instruction sheet, as given.    Follow-Up: At [facility], you and your health needs are our priority.  As part of our continuing mission to provide you with exceptional heart care, we have created designated Provider Care Teams.  These Care Teams include your primary Cardiologist (physician) and Advanced Practice Providers (APPs -  Physician Assistants and Nurse Practitioners) who all work together to provide you with the care you need, when you need it.  We recommend signing up for the patient portal called "MyChart".  Sign up information is provided on this After Visit Summary.  MyChart is used to connect with patients for Virtual Visits (Telemedicine).  Patients are able to view lab/test results, encounter notes, upcoming appointments, etc.  Non-urgent messages can be sent to your provider as well.   To learn more about what you can do with MyChart, go to [email or web address].    Your next appointment:   2 week(s)  The format for your next appointment:   In Person  Provider:   Joni Reining, FNP, DNP

## 2022-10-18 ENCOUNTER — Telehealth: Payer: Self-pay | Admitting: Cardiology

## 2022-10-18 NOTE — Telephone Encounter (Signed)
Office visit note routed to referring MD via EPIC, as requested.

## 2022-10-18 NOTE — Telephone Encounter (Signed)
Calling to get patient notes from 12/29 appt. Fax to [phone]. Please advise

## 2022-10-19 ENCOUNTER — Ambulatory Visit (HOSPITAL_COMMUNITY): Payer: Managed Care, Other (non HMO) | Attending: Adult Health

## 2022-10-19 ENCOUNTER — Telehealth (HOSPITAL_COMMUNITY): Payer: Self-pay | Admitting: *Deleted

## 2022-10-19 DIAGNOSIS — I2583 Coronary atherosclerosis due to lipid rich plaque: Secondary | ICD-10-CM

## 2022-10-19 DIAGNOSIS — I2510 Atherosclerotic heart disease of native coronary artery without angina pectoris: Secondary | ICD-10-CM

## 2022-10-19 LAB — MYOCARDIAL PERFUSION IMAGING
Angina Index: 0
Duke Treadmill Score: -1
Estimated workload: 6.4
Exercise duration (min): 4 min
Exercise duration (sec): 30 s
LV dias vol: 30 mL (ref 62–150)
LV sys vol: 67 mL
MPHR: 152 {beats}/min
Nuc Stress EF: 55 %
Peak HR: 150 {beats}/min
Percent HR: 98 %
Rest HR: 88 {beats}/min
Rest Nuclear Isotope Dose: 9.5 mCi
SDS: 0
SRS: 0
SSS: 0
ST Depression (mm): 1 mm
Stress Nuclear Isotope Dose: 29.7 mCi
TID: 1.07

## 2022-10-19 MED ORDER — TECHNETIUM TC 99M TETROFOSMIN IV KIT
9.5000 | PACK | Freq: Once | INTRAVENOUS | Status: AC | PRN
  Administered 2022-10-19: 9.5 via INTRAVENOUS

## 2022-10-19 MED ORDER — TECHNETIUM TC 99M TETROFOSMIN IV KIT
30.9000 | PACK | Freq: Once | INTRAVENOUS | Status: AC | PRN
  Administered 2022-10-19: 30.9 via INTRAVENOUS

## 2022-10-22 ENCOUNTER — Telehealth: Payer: Self-pay | Admitting: Cardiology

## 2022-10-22 NOTE — Telephone Encounter (Signed)
Patient called to get test results. 

## 2022-10-22 NOTE — Telephone Encounter (Signed)
  Lendon Colonel, NP 10/19/2022  5:21 PM EST     Reviewed stress test results. Test is normal and not indicative of blood flow issues to heart muscle.  He had poor exercise tolerance.  If symptoms continue to persist he will need to follow-up with his primary cardiologist to discuss further testing.    Pt is aware of the above results.  He and wife (on speaker phone) state they will f/u at appt as scheduled on Friday.  Pt will call back prior to then if any questions or concerns.

## 2022-10-24 ENCOUNTER — Telehealth: Payer: Self-pay

## 2022-10-24 NOTE — Telephone Encounter (Addendum)
Called patient regarding results. Patient had understanding of results.Travis Hamilton----- Message from Lendon Colonel, NP sent at 10/19/2022  5:21 PM EST ----- Reviewed stress test results. Test is normal and not indicative of blood flow issues to heart muscle.  He had poor exercise tolerance.  If symptoms continue to persist he will need to follow-up with his primary cardiologist to discuss further testing.

## 2022-10-24 NOTE — Progress Notes (Signed)
Cardiology Clinic Note   Patient Name: Travis Hamilton Date of Encounter: 10/26/2022  Primary Care Provider:  Fanny Bien, MD Primary Cardiologist:  None  Patient Profile    67 year old male with history of paroxysmal atrial tachycardia, hypertension, hyperlipidemia, former tobacco abuse, and Type II diabetes. CT coronary with FFR on 07/20/2022 revealed no significant stenosis of the LM, LAD 0.87 proximal LAD, and 0.71 distal LAD, LCX and RCA with no significant stenosis. By [facility], the mid LAD stenosis appeared to be hemodynamically significant, though the proximal LAD plaque appeared to create a more significant stenosis, however, by FFR, the proximal LAD plaque is not hemodynamically stenosed. He is s/p right inguinal hernia repair on 09/11/2022 by Dr. Erroll Luna on 09/11/2022. On last office visit he continued to have ongoing complaints of DOE. PFTs were ordered. Travis Hamilton was completed on 10/19/2022 which was low risk.  Past Medical History    Past Medical History:  Diagnosis Date   Diabetes mellitus without complication (HCC)    GERD (gastroesophageal reflux disease)    Hernia    Hyperlipidemia    Hypertension    PAT (paroxysmal atrial tachycardia)    not had problems in over 15 yrs   Past Surgical History:  Procedure Laterality Date   CARPAL TUNNEL RELEASE Right 11/12/2017   Procedure: RIGHT CARPAL TUNNEL RELEASE;  Surgeon: Daryll Brod, MD;  Location: [place];  Service: Orthopedics;  Laterality: Right;  FAB   HERNIA REPAIR  1989   LOWER LEFT SIDE   INGUINAL HERNIA REPAIR Right 09/11/2022   Procedure: RIGHT INGUINAL HERNIA REPAIR WITH MESH;  Surgeon: Erroll Luna, MD;  Location: [place];  Service: General;  Laterality: Right;  TAP BLOCK   PARTIAL DISC REMOVAL  1982   L-5   TONSILLECTOMY  1960    Allergies  No Known Allergies  History of Present Illness    Travis Hamilton is a pleasant 67 year old male we are seeing on follow-up  to discuss test results.  He denies any new symptoms.  He continues to have some shortness of breath on occasion.  He has quit smoking.  He is going to follow-up with his primary care for further evaluation of his breathing status.  Main complaint is shortness of breath with taking a shower causing him to have to rest.  Stress test and echo were normal.  Home Medications    Current Outpatient Medications  Medication Sig Dispense Refill   aspirin-acetaminophen-caffeine (EXCEDRIN MIGRAINE) 250-250-65 MG tablet Take 2 tablets by mouth daily.     ezetimibe (ZETIA) 10 MG tablet Take 1 tablet (10 mg total) by mouth daily. 90 tablet 3   famotidine (PEPCID) 40 MG tablet Take 40 mg by mouth daily.     insulin glargine, 1 Unit Dial, (TOUJEO SOLOSTAR) 300 UNIT/ML Solostar Pen 25 Units.     JARDIANCE 10 MG TABS tablet Take 10 mg by mouth daily.     lisinopril (ZESTRIL) 40 MG tablet Take 80 mg by mouth daily.     Misc Natural Products (GLUCOSAMINE CHOND CMP TRIPLE PO)      rosuvastatin (CRESTOR) 20 MG tablet Take 1 tablet (20 mg total) by mouth daily. 90 tablet 3   SYNJARDY XR 25-1000 MG TB24 Take 1 tablet by mouth daily.     methocarbamol (ROBAXIN-750) 750 MG tablet Take 1 tablet (750 mg total) by mouth every 6 (six) hours as needed for muscle spasms. 12 tablet 1   oxyCODONE (OXY IR/ROXICODONE) 5  MG immediate release tablet Take 1 tablet (5 mg total) by mouth every 6 (six) hours as needed for severe pain. 15 tablet 0   No current facility-administered medications for this visit.     Family History    Family History  Problem Relation Age of Onset   Arthritis Sister    He indicated that his mother is deceased. He indicated that his father is deceased. He indicated that his sister is alive.  Social History    Social History   Socioeconomic History   Marital status: Married    Spouse name: Not on file   Number of children: Not on file   Years of education: Not on file   Highest education level:  Not on file  Occupational History   Not on file  Tobacco Use   Smoking status: Former    Types: Cigarettes    Quit date: 02/13/2007    Years since quitting: 15.7   Smokeless tobacco: Never  Substance and Sexual Activity   Alcohol use: Yes    Comment: ocassionally   Drug use: No   Sexual activity: Not on file  Other Topics Concern   Not on file  Social History Narrative   Not on file   Social Determinants of Health   Financial Resource Strain: Not on file  Food Insecurity: Not on file  Transportation Needs: Not on file  Physical Activity: Not on file  Stress: Not on file  Social Connections: Not on file  Intimate Partner Violence: Not on file     Review of Systems    General:  No chills, fever, night sweats or weight changes.  Cardiovascular:  No chest pain, dyspnea on exertion, edema, orthopnea, palpitations, paroxysmal nocturnal dyspnea. Dermatological: No rash, lesions/masses Respiratory: No cough, dyspnea Urologic: No hematuria, dysuria Abdominal:   No nausea, vomiting, diarrhea, bright red blood per rectum, melena, or hematemesis Neurologic:  No visual changes, wkns, changes in mental status. All other systems reviewed and are otherwise negative except as noted above.     Physical Exam    VS:  BP 132/76   Pulse 94   Ht 5\' 8"  (1.727 m)   Wt 222 lb 3.2 oz (100.8 kg)   SpO2 95%   BMI 33.79 kg/m  , BMI Body mass index is 33.79 kg/m.     GEN: Well nourished, well developed, in no acute distress. HEENT: normal. Neck: Supple, no JVD, carotid bruits, or masses. Cardiac: RRR, no murmurs, rubs, or gallops. No clubbing, cyanosis, edema.  Radials/DP/PT 2+ and equal bilaterally.  Respiratory:  Respirations regular and unlabored, clear to auscultation bilaterally. GI: Soft, nontender, nondistended, BS + x 4. MS: no deformity or atrophy. Skin: warm and dry, no rash. Neuro:  Strength and sensation are intact. Psych: Normal affect.  Accessory Clinical Findings     Lab Results  Component Value Date   WBC 7.3 11/29/2021   HGB 15.5 11/29/2021   HCT 47.2 11/29/2021   MCV 87.9 11/29/2021   PLT 197 11/29/2021   Lab Results  Component Value Date   CREATININE 1.30 (H) 09/04/2022   BUN 16 09/04/2022   NA 136 09/04/2022   K 4.3 09/04/2022   CL 104 09/04/2022   CO2 22 09/04/2022   Lab Results  Component Value Date   ALT 25 10/19/2015   AST 25 10/19/2015   ALKPHOS 48 10/19/2015   BILITOT 0.6 10/19/2015   Lab Results  Component Value Date   CHOL 134 09/19/2022   HDL 32 (  L) 09/19/2022   LDLCALC 77 09/19/2022   TRIG 142 09/19/2022   CHOLHDL 4.2 09/19/2022    No results found for: "HGBA1C"  Review of Prior Studies:  The study is normal. The study is low risk.   1.0 mm of horizontal ST depression (II, III, aVF and V6) was noted.   LV perfusion is normal.   Left ventricular function is normal. Nuclear stress EF: 55 %. The left ventricular ejection fraction is normal (55-65%). End diastolic cavity size is normal.   Prior study not available for comparison.   Low risk stress nuclear study with normal perfusion and normal left ventricular regional and global systolic function. However, reduced exercise tolerance, ST segment depression during exercise and frequent premature atrial contractions during exercise raise the possibility of "balanced ischemia" with multivessel coronary artery disease.  Coronary CTA with FFR 07/20/2022   1. Left Main: No significant stenosis.   2. LAD: 0.87 proximal LAD, 0.71 distal LAD. 3. LCX: No significant stenosis. 4. RCA: No significant stenosis.   IMPRESSION: 1. by FFR, the mid LAD stenosis appears to be hemodynamically significant though the proximal LAD plaque visually appears to create more significant stenosis. By FFR, the proximal LAD plaque is not hemodynamically stenosis.   Marca Ancona, MD   Echocardiogram 07/05/2022 1. Left ventricular ejection fraction, by estimation, is 60 to 65%. The   left ventricle has normal function. The left ventricle has no regional  wall motion abnormalities. Left ventricular diastolic parameters are  consistent with Grade I diastolic  dysfunction (impaired relaxation).   2. Right ventricular systolic function is normal. The right ventricular  size is normal. Tricuspid regurgitation signal is inadequate for assessing  PA pressure.   3. The mitral valve is normal in structure. No evidence of mitral valve  regurgitation. No evidence of mitral stenosis.   4. The aortic valve is tricuspid. Aortic valve regurgitation is not  visualized. No aortic stenosis is present.   5. The inferior vena cava is dilated in size with >50% respiratory  variability, suggesting right atrial pressure of 8 mmHg.   Assessment & Plan   1.  Coronary artery disease: CT scan completed in October 2023 revealed no significant stenosis of the left main, LAD 0.87 proximal and 0.71 distal, with left circumflex and RCA having no stenosis.  Repeat stress Myoview revealed no reversible ischemia and found to be low risk.  He will continue on current medication regimen to keep blood pressure, cholesterol, and heart rate under control.  Continue CVRF modification with increased exercise, weight loss, and adherence to low-cholesterol diet.  2.  Hypertension: Excellent control of blood pressure today.  No changes in his regimen.  Labs are followed by PCP.  Low-sodium diet is recommended  3.  Hypercholesterolemia: Continue on rosuvastatin 20 mg daily along with Zetia 10 mg daily.  Annual lipid profile is recommended to keep LDL less than 70.  Mediterranean diet is recommended.  As well as weight loss and exercise for heart healthy benefits.  Current medicines are reviewed at length with the patient today.  I have spent 15 min's  dedicated to the care of this patient on the date of this encounter to include pre-visit review of records, assessment, management and diagnostic testing,with shared  decision making. Signed, Bettey Mare. Liborio Nixon, ANP, AACC   10/26/2022 2:16 PM      Office [phone] Fax [phone]  Notice: This dictation was prepared with Dragon dictation along with smaller phrase technology. Any transcriptional errors that  result from this process are unintentional and may not be corrected upon review.

## 2022-10-26 ENCOUNTER — Encounter: Payer: Self-pay | Admitting: Adult Health

## 2022-10-26 ENCOUNTER — Ambulatory Visit: Payer: Managed Care, Other (non HMO) | Attending: Adult Health | Admitting: Adult Health

## 2022-10-26 VITALS — BP 132/76 | HR 94 | Ht 68.0 in | Wt 222.2 lb

## 2022-10-26 DIAGNOSIS — I10 Essential (primary) hypertension: Secondary | ICD-10-CM | POA: Diagnosis not present

## 2022-10-26 DIAGNOSIS — E7800 Pure hypercholesterolemia, unspecified: Secondary | ICD-10-CM | POA: Diagnosis not present

## 2022-10-26 DIAGNOSIS — I2510 Atherosclerotic heart disease of native coronary artery without angina pectoris: Secondary | ICD-10-CM

## 2022-10-26 DIAGNOSIS — I2583 Coronary atherosclerosis due to lipid rich plaque: Secondary | ICD-10-CM

## 2022-10-26 NOTE — Patient Instructions (Addendum)
Medication Instructions:  No Changes *If you need a refill on your cardiac medications before your next appointment, please call your pharmacy*   Lab Work: No Labs If you have labs (blood work) drawn today and your tests are completely normal, you will receive your results only by: [place] (if you have MyChart) OR A paper copy in the mail If you have any lab test that is abnormal or we need to change your treatment, we will call you to review the results.   Testing/Procedures: No Testing   Follow-Up: At [facility], you and your health needs are our priority.  As part of our continuing mission to provide you with exceptional heart care, we have created designated Provider Care Teams.  These Care Teams include your primary Cardiologist (physician) and Advanced Practice Providers (APPs -  Physician Assistants and Nurse Practitioners) who all work together to provide you with the care you need, when you need it.  We recommend signing up for the patient portal called "MyChart".  Sign up information is provided on this After Visit Summary.  MyChart is used to connect with patients for Virtual Visits (Telemedicine).  Patients are able to view lab/test results, encounter notes, upcoming appointments, etc.  Non-urgent messages can be sent to your provider as well.   To learn more about what you can do with MyChart, go to [email or web address].    Your next appointment:   1 year(s)  Provider:   Candee Furbish, MD

## 2022-11-08 ENCOUNTER — Other Ambulatory Visit: Payer: Self-pay | Admitting: Family Medicine

## 2022-11-08 DIAGNOSIS — R0602 Shortness of breath: Secondary | ICD-10-CM

## 2023-01-07 ENCOUNTER — Ambulatory Visit: Payer: Managed Care, Other (non HMO) | Attending: Cardiology

## 2023-01-07 DIAGNOSIS — I2510 Atherosclerotic heart disease of native coronary artery without angina pectoris: Secondary | ICD-10-CM

## 2023-01-08 LAB — LIPID PANEL
Chol/HDL Ratio: 2.5 ratio (ref 0.0–5.0)
Cholesterol, Total: 94 mg/dL — ABNORMAL LOW (ref 100–199)
HDL: 37 mg/dL — ABNORMAL LOW (ref >39)
LDL Chol Calc (NIH): 42 mg/dL (ref 0–99)
Triglycerides: 66 mg/dL (ref 0–149)
VLDL Cholesterol Cal: 15 mg/dL (ref 5–40)

## 2023-03-14 ENCOUNTER — Ambulatory Visit (INDEPENDENT_AMBULATORY_CARE_PROVIDER_SITE_OTHER): Payer: Managed Care, Other (non HMO) | Admitting: Pulmonary Disease

## 2023-03-14 ENCOUNTER — Encounter: Payer: Self-pay | Admitting: Pulmonary Disease

## 2023-03-14 VITALS — BP 132/74 | HR 90 | Temp 98.0°F | Ht 68.0 in | Wt 214.0 lb

## 2023-03-14 DIAGNOSIS — R0609 Other forms of dyspnea: Secondary | ICD-10-CM

## 2023-03-14 MED ORDER — BREZTRI AEROSPHERE 160-9-4.8 MCG/ACT IN AERO: 2.0000 | INHALATION_SPRAY | Freq: Two times a day (BID) | RESPIRATORY_TRACT | 0 refills | Status: DC

## 2023-03-14 NOTE — Patient Instructions (Signed)
Nice to meet you  [name], 2 puffs twice a day, rinse your mouth out with water after every use  Will get pulmonary function test for further evaluation of your breathing  Return to clinic in 2 months or sooner as needed, after PFTs

## 2023-03-22 ENCOUNTER — Telehealth: Payer: Medicare Other | Admitting: Pulmonary Disease

## 2023-03-22 NOTE — Telephone Encounter (Signed)
Referring Dr. Duanne Guess) would like appt notes from first visit   [phone] (Fax)

## 2023-03-22 NOTE — Telephone Encounter (Signed)
Called pt to get verbal consent to sent over ov notes. Pt verbalized consent I will fax this paperwork to Dr.Dewey. Nothing further needed

## 2023-03-26 NOTE — Progress Notes (Signed)
@Patient  ID: Travis Hamilton, male    DOB: July 26, 1956, 67 y.o.   MRN: [id number]  Chief Complaint  Patient presents with   Consult    Pt states he has been having problems with SOB when he exerts himself.    Referring provider: [name], MD  HPI:   67 y.o. man whom we are seeing for evaluation of dyspnea on exertion.  Most recent PCP note reviewed.  Dyspnea present for some time.  Months or longer.  Mildly are gradually getting worse.  Worse on inclines or stairs or carrying things.  Sometimes okay on flat distances.  No time of day when things are better or worse.  No position to make things better or worse.  No environmental or seasonal changes that he can identify that make things better or worse.  Use Breztri for short period of time, samples.  Maybe helped a little bit.  He cannot really recall.  Did not take long.  No other alleviating or exacerbating factors.  Reviewed CT coronary 07/2022 reveals clear lungs on limited view of the lungs on my review and interpretation.  Reviewed CTA PE protocol 07/2016 that shows very mild emphysematous changes in the apices on my review and interpretation, otherwise clear.  PMH: Seasonal allergies, hyperlipidemia, diabetes Surgical history: Hernia repair, tonsillectomy, lumbar disc surgery Family history: No significant respiratory illness of first-degree relatives Social history: Former smoker, approximate 50 pack years, quit in 2008, lives in [place] / Pulmonary Flowsheets:   ACT:      No data to display          MMRC:     No data to display          Epworth:      No data to display          Tests:   FENO:  No results found for: "NITRICOXIDE"  PFT:     No data to display          WALK:      No data to display          Imaging: Personally reviewed and as per EMR and discussion in this note No results found.  Lab Results: Personally reviewed, notably eosinophils  elevated in 2023 CBC    Component Value Date/Time   WBC 7.3 11/29/2021 0923   RBC 5.37 11/29/2021 0923   HGB 15.5 11/29/2021 0923   HCT 47.2 11/29/2021 0923   PLT 197 11/29/2021 0923   MCV 87.9 11/29/2021 0923   MCH 28.9 11/29/2021 0923   MCHC 32.8 11/29/2021 0923   RDW 12.6 11/29/2021 0923   LYMPHSABS 2.3 11/29/2021 0923   MONOABS 1.1 (H) 11/29/2021 0923   EOSABS 0.4 11/29/2021 0923   BASOSABS 0.1 11/29/2021 0923    BMET    Component Value Date/Time   NA 136 09/04/2022 1350   NA 141 06/25/2022 1427   K 4.3 09/04/2022 1350   CL 104 09/04/2022 1350   CO2 22 09/04/2022 1350   GLUCOSE 225 (H) 09/04/2022 1350   BUN 16 09/04/2022 1350   BUN 17 06/25/2022 1427   CREATININE 1.30 (H) 09/04/2022 1350   CALCIUM 9.1 09/04/2022 1350   GFRNONAA 60 (L) 09/04/2022 1350   GFRAA >60 11/07/2017 1200    BNP    Component Value Date/Time   BNP 18.6 11/29/2021 0923    ProBNP No results found for: "PROBNP"  Specialty Problems   None   No Known Allergies  There is no immunization history on file for this patient.  Past Medical History:  Diagnosis Date   Diabetes mellitus without complication (HCC)    GERD (gastroesophageal reflux disease)    Hernia    Hyperlipidemia    Hypertension    PAT (paroxysmal atrial tachycardia)    not had problems in over 15 yrs    Tobacco History: Social History   Tobacco Use  Smoking Status Former   Packs/day: 1.25   Years: 38.00   Additional pack years: 0.00   Total pack years: 47.50   Types: Cigarettes   Quit date: 02/13/2007   Years since quitting: 16.1  Smokeless Tobacco Never   Counseling given: Not Answered   Continue to not smoke  Outpatient Encounter Medications as of 03/14/2023  Medication Sig   aspirin-acetaminophen-caffeine (EXCEDRIN MIGRAINE) 250-250-65 MG tablet Take 2 tablets by mouth daily.   Budeson-Glycopyrrol-Formoterol (BREZTRI AEROSPHERE) 160-9-4.8 MCG/ACT AERO Inhale 2 puffs into the lungs in the morning and  at bedtime.   ezetimibe (ZETIA) 10 MG tablet Take 1 tablet (10 mg total) by mouth daily.   famotidine (PEPCID) 40 MG tablet Take 40 mg by mouth daily.   insulin glargine, 1 Unit Dial, (TOUJEO SOLOSTAR) 300 UNIT/ML Solostar Pen 25 Units.   lisinopril (ZESTRIL) 40 MG tablet Take 80 mg by mouth daily.   Misc Natural Products (GLUCOSAMINE CHOND CMP TRIPLE PO)    rosuvastatin (CRESTOR) 20 MG tablet Take 1 tablet (20 mg total) by mouth daily.   SYNJARDY XR 25-1000 MG TB24 Take 1 tablet by mouth daily.   albuterol (VENTOLIN HFA) 108 (90 Base) MCG/ACT inhaler Inhale 2 puffs into the lungs every 6 (six) hours as needed. (Patient not taking: Reported on 03/14/2023)   BREZTRI AEROSPHERE 160-9-4.8 MCG/ACT AERO Inhale 2 puffs into the lungs 2 (two) times daily. (Patient not taking: Reported on 03/14/2023)   [DISCONTINUED] JARDIANCE 10 MG TABS tablet Take 10 mg by mouth daily.   [DISCONTINUED] methocarbamol (ROBAXIN-750) 750 MG tablet Take 1 tablet (750 mg total) by mouth every 6 (six) hours as needed for muscle spasms.   [DISCONTINUED] oxyCODONE (OXY IR/ROXICODONE) 5 MG immediate release tablet Take 1 tablet (5 mg total) by mouth every 6 (six) hours as needed for severe pain.   No facility-administered encounter medications on file as of 03/14/2023.     Review of Systems  Review of Systems  No chest pain with exertion.  No orthopnea or PND.  Comprehensive review of systems otherwise negative. Physical Exam  BP 132/74 (BP Location: Right Arm, Patient Position: Sitting, Cuff Size: Normal)   Pulse 90   Temp 98 F (36.7 C) (Oral)   Ht 5\' 8"  (1.727 m)   Wt 214 lb (97.1 kg)   SpO2 96% Comment: RA  BMI 32.54 kg/m   Wt Readings from Last 5 Encounters:  03/14/23 214 lb (97.1 kg)  10/26/22 222 lb 3.2 oz (100.8 kg)  10/12/22 223 lb 6.4 oz (101.3 kg)  09/11/22 220 lb 7.4 oz (100 kg)  06/25/22 221 lb (100.2 kg)    BMI Readings from Last 5 Encounters:  03/14/23 32.54 kg/m  10/26/22 33.79 kg/m   10/12/22 33.97 kg/m  09/11/22 33.52 kg/m  06/25/22 33.60 kg/m     Physical Exam General: Sitting in chair, no acute distress Eyes: EOMI, no icterus Neck: Supple, no JVP Pulmonary: Clear, normal work of breathing Cardiovascular: Warm, no edema Abdomen: Nondistended, bowel sounds present MSK: No synovitis, no joint effusion Neuro: Normal gait, no weakness Psych: Normal  mood, full affect   Assessment & Plan:   Dyspnea on exertion: Former smoker, close 50-pack-year, possible smoking-related lung disease.  Also suspicion for possible asthma.  Cardiac etiology such as diastolic dysfunction also considered.  Recommend maximal inhaler therapy via Breztri 2 puff twice daily.  Pulmonary function test for further evaluation.  Most recent cross-sectional imaging of the chest 07/2022 with clear lungs on my review interpretation.  Tobacco abuse in remission: Congratulated on ongoing cigarette abstinence.  Last quit smoking 16 years ago, not a candidate for lung cancer screening.   Return in about 2 months (around 05/14/2023) for f/u Dr. Judeth Horn, after PFT.   Karren Burly, MD 03/26/2023

## 2023-04-12 ENCOUNTER — Ambulatory Visit: Payer: Medicare Other | Admitting: Cardiology

## 2023-04-22 ENCOUNTER — Ambulatory Visit: Payer: Managed Care, Other (non HMO) | Attending: Cardiology | Admitting: Cardiology

## 2023-04-22 ENCOUNTER — Encounter: Payer: Self-pay | Admitting: Cardiology

## 2023-04-22 VITALS — BP 138/62 | HR 97 | Ht 68.0 in | Wt 222.0 lb

## 2023-04-22 DIAGNOSIS — E119 Type 2 diabetes mellitus without complications: Secondary | ICD-10-CM

## 2023-04-22 DIAGNOSIS — I2510 Atherosclerotic heart disease of native coronary artery without angina pectoris: Secondary | ICD-10-CM | POA: Diagnosis not present

## 2023-04-22 DIAGNOSIS — I10 Essential (primary) hypertension: Secondary | ICD-10-CM | POA: Diagnosis not present

## 2023-04-22 DIAGNOSIS — Z01812 Encounter for preprocedural laboratory examination: Secondary | ICD-10-CM | POA: Diagnosis not present

## 2023-04-22 DIAGNOSIS — Z794 Long term (current) use of insulin: Secondary | ICD-10-CM

## 2023-04-22 DIAGNOSIS — R0602 Shortness of breath: Secondary | ICD-10-CM

## 2023-04-22 NOTE — Addendum Note (Signed)
Addended by: Jake Bathe on: 04/22/2023 06:49 PM   Modules accepted: Orders

## 2023-04-22 NOTE — H&P (View-Only) (Signed)
Cardiology Office Note:  .   Date:  04/22/2023  ID:  Travis Hamilton, DOB 01/09/1956, MRN [id number] PCP: Dewey, Elizabeth R, MD  [facility] Providers Cardiologist:  Yessica Putnam, MD    History of Present Illness: .   Travis Hamilton is a 66 y.o. male with former tobacco use type 2 diabetes hypertension hyperlipidemia paroxysmal atrial tachycardia with moderate level coronary artery disease in multiple vessels.  Proximal LAD was not hemodynamically FFR analysis stenosis however looked fairly significant on CT.  He has continued shortness of breath with exertion.  Taking a shower has to stop and rest.  Mild chest tightness.  Prior stress test and echocardiogram were unremarkable.  Unable to estimate pulmonary pressures on echo.  He is on Jardiance.  Seeing pulmonary.  Awaiting PFTs.  Currently on Breztri and albuterol.  Works for [employer].  Has coworkers, larger than him, that are not as fatigued.  ROS: No syncope no bleeding  Studies Reviewed: .   EKG Interpretation Date/Time:  Monday April 22 2023 15:34:51 EDT Ventricular Rate:  109 PR Interval:  122 QRS Duration:  82 QT Interval:  330 QTC Calculation: 444 R Axis:   51  Text Interpretation: Sinus tachycardia When compared with ECG of 29-Nov-2021 08:38, No significant change was found Confirmed by Fay Bagg ([id number]) on 04/22/2023 4:38:17 PM    CT FFR, echocardiogram, SPECT nuclear reviewed  Creatinine 1.3, LDL 42 triglycerides 66 hemoglobin A1c 7.0 TSH 0.9, hemoglobin 15.5  Risk Assessment/Calculations:            Physical Exam:   VS:  BP 138/62   Pulse 97   Ht 5' 8" (1.727 m)   Wt 222 lb (100.7 kg)   SpO2 98%   BMI 33.75 kg/m    Wt Readings from Last 3 Encounters:  04/22/23 222 lb (100.7 kg)  03/14/23 214 lb (97.1 kg)  10/26/22 222 lb 3.2 oz (100.8 kg)    GEN: Well nourished, well developed in no acute distress NECK: No JVD; No carotid bruits CARDIAC: Tachycardic regular, no murmurs, rubs,  gallops RESPIRATORY:  Clear to auscultation without rales, wheezing or rhonchi  ABDOMEN: Soft, non-tender, non-distended EXTREMITIES:  No edema; No deformity   ASSESSMENT AND PLAN: .    Coronary artery disease - Continues to have shortness of breath, FFR analysis showed 0.71 distal LAD, 0.87 proximal.  RCA had no stenosis.  Stress test was overall felt to be low risk. -Given his ongoing progression of symptoms, we will go ahead and proceed with a right and left heart catheterization.  Risks and benefits of been explained including stroke heart attack death renal impairment bleeding.  He is willing to proceed.  I would like to check his pulmonary pressures as well given his prior smoking history.   Diabetes with primary hypertension - Overall reasonably controlled.  Lisinopril 80 mg -Also on Jardiance metformin combination.  He will need to hold this for 3 days prior to his heart catheterization.  Hyperlipidemia - On Crestor 20 mg and Zetia 10 mg LDL 42.  Excellent.    Informed Consent   Shared Decision Making/Informed Consent The risks [stroke (1 in 1000), death (1 in 1000), kidney failure [usually temporary] (1 in 500), bleeding (1 in 200), allergic reaction [possibly serious] (1 in 200)], benefits (diagnostic support and management of coronary artery disease) and alternatives of a cardiac catheterization were discussed in detail with Mr. Renwick and he is willing to proceed.     Dispo: 6 months    Signed, Giara Mcgaughey, MD   

## 2023-04-22 NOTE — Patient Instructions (Addendum)
Medication Instructions:  The current medical regimen is effective;  continue present plan and medications.  *If you need a refill on your cardiac medications before your next appointment, please call your pharmacy*   Lab Work: Please have blood work today (CBC, BMP)  If you have labs (blood work) drawn today and your tests are completely normal, you will receive your results only by: MyChart Message (if you have MyChart) OR A paper copy in the mail If you have any lab test that is abnormal or we need to change your treatment, we will call you to review the results.   Testing/Procedures:   [facility] A DEPT OF [place]. [facility] AT [facility] [address] [place], [place] [address] [id number] [facility] [place] [address] Dept: [phone] Loc: [phone]  Travis Hamilton  04/22/2023  You are scheduled for a Cardiac Catheterization on Friday, July 12 with Dr. Peter Swaziland.  1. Please arrive at the [facility] (Main Entrance A) at [facility]: [address] [place], [place] [address] at 5:30 AM (two hours before your procedure to ensure your preparation). Free valet parking service is available.   Special note: Every effort is made to have your procedure done on time. Please understand that emergencies sometimes delay scheduled procedures.  2. Diet: Do not eat or drink anything after midnight prior to your procedure except sips of water to take medications.  3. Labs: CBC, BMP today  4. Medication instructions in preparation for your procedure:  Stop taking, Synjardy  Monday, July 8,    Take only 12 units of insulin the night before your procedure. Do not take any insulin on the day of the procedure.    The morning of your cath you may take your other morning medicines NOT listed above.  You may use sips of water.  5. Plan for one night stay--bring personal belongings. 6. Bring a current list of your medications and current  insurance cards. 7. You MUST have a responsible person to drive you home. 8. Someone MUST be with you the first 24 hours after you arrive home or your discharge will be delayed. 9. Please wear clothes that are easy to get on and off and wear slip-on shoes.  Thank you for allowing [place] to care for you!   -- [facility]  Follow-Up: At [facility], you and your health needs are our priority.  As part of our continuing mission to provide you with exceptional heart care, we have created designated Provider Care Teams.  These Care Teams include your primary Cardiologist (physician) and Advanced Practice Providers (APPs -  Physician Assistants and Nurse Practitioners) who all work together to provide you with the care you need, when you need it.  We recommend signing up for the patient portal called "MyChart".  Sign up information is provided on this After Visit Summary.  MyChart is used to connect with patients for Virtual Visits (Telemedicine).  Patients are able to view lab/test results, encounter notes, upcoming appointments, etc.  Non-urgent messages can be sent to your provider as well.   To learn more about what you can do with MyChart, go to [email or web address].    Your next appointment:   6 month(s)  Provider:   Donato Schultz, MD

## 2023-04-22 NOTE — Progress Notes (Signed)
Cardiology Office Note:  .   Date:  04/22/2023  ID:  Travis Hamilton, DOB 1956/04/28, MRN [id number] PCP: [name], MD  [facility] Providers Cardiologist:  Donato Schultz, MD    History of Present Illness: .   Travis Hamilton is a 67 y.o. male with former tobacco use type 2 diabetes hypertension hyperlipidemia paroxysmal atrial tachycardia with moderate level coronary artery disease in multiple vessels.  Proximal LAD was not hemodynamically FFR analysis stenosis however looked fairly significant on CT.  He has continued shortness of breath with exertion.  Taking a shower has to stop and rest.  Mild chest tightness.  Prior stress test and echocardiogram were unremarkable.  Unable to estimate pulmonary pressures on echo.  He is on [place].  Seeing pulmonary.  Awaiting PFTs.  Currently on Breztri and albuterol.  Works for [employer].  Has coworkers, larger than him, that are not as fatigued.  ROS: No syncope no bleeding  Studies Reviewed: Marland Kitchen   EKG Interpretation Date/Time:  Monday April 22 2023 15:34:51 EDT Ventricular Rate:  109 PR Interval:  122 QRS Duration:  82 QT Interval:  330 QTC Calculation: 444 R Axis:   51  Text Interpretation: Sinus tachycardia When compared with ECG of 29-Nov-2021 08:38, No significant change was found Confirmed by Donato Schultz ([id number]) on 04/22/2023 4:38:17 PM    CT FFR, echocardiogram, SPECT nuclear reviewed  Creatinine 1.3, LDL 42 triglycerides 66 hemoglobin A1c 7.0 TSH 0.9, hemoglobin 15.5  Risk Assessment/Calculations:            Physical Exam:   VS:  BP 138/62   Pulse 97   Ht 5\' 8"  (1.727 m)   Wt 222 lb (100.7 kg)   SpO2 98%   BMI 33.75 kg/m    Wt Readings from Last 3 Encounters:  04/22/23 222 lb (100.7 kg)  03/14/23 214 lb (97.1 kg)  10/26/22 222 lb 3.2 oz (100.8 kg)    GEN: Well nourished, well developed in no acute distress NECK: No JVD; No carotid bruits CARDIAC: Tachycardic regular, no murmurs, rubs,  gallops RESPIRATORY:  Clear to auscultation without rales, wheezing or rhonchi  ABDOMEN: Soft, non-tender, non-distended EXTREMITIES:  No edema; No deformity   ASSESSMENT AND PLAN: .    Coronary artery disease - Continues to have shortness of breath, FFR analysis showed 0.71 distal LAD, 0.87 proximal.  RCA had no stenosis.  Stress test was overall felt to be low risk. -Given his ongoing progression of symptoms, we will go ahead and proceed with a right and left heart catheterization.  Risks and benefits of been explained including stroke heart attack death renal impairment bleeding.  He is willing to proceed.  I would like to check his pulmonary pressures as well given his prior smoking history.   Diabetes with primary hypertension - Overall reasonably controlled.  Lisinopril 80 mg -Also on Jardiance metformin combination.  He will need to hold this for 3 days prior to his heart catheterization.  Hyperlipidemia - On Crestor 20 mg and Zetia 10 mg LDL 42.  Excellent.    Informed Consent   Shared Decision Making/Informed Consent The risks [stroke (1 in 1000), death (1 in 1000), kidney failure [usually temporary] (1 in 500), bleeding (1 in 200), allergic reaction [possibly serious] (1 in 200)], benefits (diagnostic support and management of coronary artery disease) and alternatives of a cardiac catheterization were discussed in detail with Mr. Bozell and he is willing to proceed.     Dispo: 6 months  Signed, Donato Schultz, MD

## 2023-04-23 LAB — CBC
Hematocrit: 44.3 % (ref 37.5–51.0)
Hemoglobin: 14.5 g/dL (ref 13.0–17.7)
MCH: 28.3 pg (ref 26.6–33.0)
MCHC: 32.7 g/dL (ref 31.5–35.7)
MCV: 86 fL (ref 79–97)
Platelets: 224 10*3/uL (ref 150–450)
RBC: 5.13 x10E6/uL (ref 4.14–5.80)
RDW: 13.2 % (ref 11.6–15.4)
WBC: 7.5 10*3/uL (ref 3.4–10.8)

## 2023-04-23 LAB — BASIC METABOLIC PANEL
BUN/Creatinine Ratio: 14 (ref 10–24)
BUN: 17 mg/dL (ref 8–27)
CO2: 22 mmol/L (ref 20–29)
Calcium: 9.7 mg/dL (ref 8.6–10.2)
Chloride: 105 mmol/L (ref 96–106)
Creatinine, Ser: 1.2 mg/dL (ref 0.76–1.27)
Glucose: 134 mg/dL — ABNORMAL HIGH (ref 70–99)
Potassium: 4.8 mmol/L (ref 3.5–5.2)
Sodium: 141 mmol/L (ref 134–144)
eGFR: 66 mL/min/{1.73_m2} (ref >59)

## 2023-04-25 ENCOUNTER — Telehealth: Payer: Self-pay | Admitting: *Deleted

## 2023-04-25 NOTE — Telephone Encounter (Addendum)
Cardiac Catheterization scheduled at [facility] for: Friday April 26, 2023 7:30 AM Arrival time [facility] Main Entrance A at: 5:30 AM  Nothing to eat after midnight prior to procedure, clear liquids until 5 AM day of procedure.  Medication instructions: -Hold:  Insulin-1/2 usual Insulin   Synjardy-day of procedure and 48 hours post procedure -Other usual morning medications can be taken with sips of water including aspirin 81 mg.  Confirmed patient has responsible adult to drive home post procedure and be with patient first 24 hours after arriving home.  Plan to go home the same day, you will only stay overnight if medically necessary.  Reviewed procedure instructions with patient.

## 2023-04-26 ENCOUNTER — Other Ambulatory Visit: Payer: Self-pay

## 2023-04-26 ENCOUNTER — Ambulatory Visit (HOSPITAL_COMMUNITY)
Admission: RE | Admit: 2023-04-26 | Discharge: 2023-04-26 | Disposition: A | Discharge status: Home / Self Care | Payer: Managed Care, Other (non HMO) | Attending: Cardiology | Admitting: Cardiology

## 2023-04-26 ENCOUNTER — Encounter (HOSPITAL_COMMUNITY)
Admission: RE | Disposition: A | Discharge status: Home / Self Care | Payer: Self-pay | Source: Home / Self Care | Attending: Cardiology

## 2023-04-26 ENCOUNTER — Other Ambulatory Visit (HOSPITAL_COMMUNITY): Payer: Self-pay

## 2023-04-26 DIAGNOSIS — R0609 Other forms of dyspnea: Secondary | ICD-10-CM | POA: Diagnosis present

## 2023-04-26 DIAGNOSIS — I2584 Coronary atherosclerosis due to calcified coronary lesion: Secondary | ICD-10-CM | POA: Diagnosis not present

## 2023-04-26 DIAGNOSIS — Z87891 Personal history of nicotine dependence: Secondary | ICD-10-CM

## 2023-04-26 DIAGNOSIS — Z01812 Encounter for preprocedural laboratory examination: Secondary | ICD-10-CM

## 2023-04-26 DIAGNOSIS — E785 Hyperlipidemia, unspecified: Secondary | ICD-10-CM | POA: Diagnosis not present

## 2023-04-26 DIAGNOSIS — I10 Essential (primary) hypertension: Secondary | ICD-10-CM | POA: Insufficient documentation

## 2023-04-26 DIAGNOSIS — I2510 Atherosclerotic heart disease of native coronary artery without angina pectoris: Secondary | ICD-10-CM | POA: Diagnosis not present

## 2023-04-26 DIAGNOSIS — E119 Type 2 diabetes mellitus without complications: Secondary | ICD-10-CM

## 2023-04-26 DIAGNOSIS — Z955 Presence of coronary angioplasty implant and graft: Secondary | ICD-10-CM

## 2023-04-26 DIAGNOSIS — R0602 Shortness of breath: Secondary | ICD-10-CM

## 2023-04-26 HISTORY — PX: CORONARY ULTRASOUND/IVUS: CATH118244

## 2023-04-26 HISTORY — PX: RIGHT/LEFT HEART CATH AND CORONARY ANGIOGRAPHY: CATH118266

## 2023-04-26 HISTORY — PX: CORONARY PRESSURE/FFR WITH 3D MAPPING: CATH118309

## 2023-04-26 HISTORY — PX: CORONARY STENT INTERVENTION: CATH118234

## 2023-04-26 LAB — POCT ACTIVATED CLOTTING TIME: Activated Clotting Time: 452 seconds

## 2023-04-26 LAB — POCT I-STAT EG7
Acid-base deficit: 2 mmol/L (ref 0.0–2.0)
Acid-base deficit: 2 mmol/L (ref 0.0–2.0)
Bicarbonate: 24 mmol/L (ref 20.0–28.0)
Bicarbonate: 24.6 mmol/L (ref 20.0–28.0)
Calcium, Ion: 1.22 mmol/L (ref 1.15–1.40)
Calcium, Ion: 1.23 mmol/L (ref 1.15–1.40)
HCT: 39 % (ref 39.0–52.0)
HCT: 40 % (ref 39.0–52.0)
Hemoglobin: 13.3 g/dL (ref 13.0–17.0)
Hemoglobin: 13.6 g/dL (ref 13.0–17.0)
O2 Saturation: 70 %
O2 Saturation: 73 %
Potassium: 4.4 mmol/L (ref 3.5–5.1)
Potassium: 4.5 mmol/L (ref 3.5–5.1)
Sodium: 139 mmol/L (ref 135–145)
Sodium: 139 mmol/L (ref 135–145)
TCO2: 25 mmol/L (ref 22–32)
TCO2: 26 mmol/L (ref 22–32)
pCO2, Ven: 45.2 mmHg (ref 44–60)
pCO2, Ven: 46.3 mmHg (ref 44–60)
pH, Ven: 7.334 (ref 7.25–7.43)
pH, Ven: 7.334 (ref 7.25–7.43)
pO2, Ven: 39 mmHg (ref 32–45)
pO2, Ven: 42 mmHg (ref 32–45)

## 2023-04-26 LAB — POCT I-STAT 7, (LYTES, BLD GAS, ICA,H+H)
Acid-base deficit: 3 mmol/L — ABNORMAL HIGH (ref 0.0–2.0)
Bicarbonate: 21.6 mmol/L (ref 20.0–28.0)
Calcium, Ion: 1.18 mmol/L (ref 1.15–1.40)
HCT: 39 % (ref 39.0–52.0)
Hemoglobin: 13.3 g/dL (ref 13.0–17.0)
O2 Saturation: 97 %
Potassium: 4.4 mmol/L (ref 3.5–5.1)
Sodium: 139 mmol/L (ref 135–145)
TCO2: 23 mmol/L (ref 22–32)
pCO2 arterial: 37.6 mmHg (ref 32–48)
pH, Arterial: 7.368 (ref 7.35–7.45)
pO2, Arterial: 91 mmHg (ref 83–108)

## 2023-04-26 LAB — GLUCOSE, CAPILLARY
Glucose-Capillary: 129 mg/dL — ABNORMAL HIGH (ref 70–99)
Glucose-Capillary: 144 mg/dL — ABNORMAL HIGH (ref 70–99)

## 2023-04-26 SURGERY — RIGHT/LEFT HEART CATH AND CORONARY ANGIOGRAPHY
Anesthesia: LOCAL

## 2023-04-26 MED ORDER — ROSUVASTATIN CALCIUM 40 MG PO TABS
40.0000 mg | ORAL_TABLET | Freq: Every day | ORAL | 3 refills | Status: DC
  Filled 2023-04-26: qty 90, 90d supply, fill #0

## 2023-04-26 MED ORDER — ASPIRIN 81 MG PO TBEC
81.0000 mg | DELAYED_RELEASE_TABLET | Freq: Every day | ORAL | 3 refills | Status: DC
  Filled 2023-04-26: qty 90, 90d supply, fill #0

## 2023-04-26 MED ORDER — CLOPIDOGREL BISULFATE 75 MG PO TABS
75.0000 mg | ORAL_TABLET | Freq: Every day | ORAL | 3 refills | Status: DC
  Filled 2023-04-26: qty 90, 90d supply, fill #0

## 2023-04-26 MED ORDER — MIDAZOLAM HCL 2 MG/2ML IJ SOLN
INTRAMUSCULAR | Status: AC
  Filled 2023-04-26: qty 2

## 2023-04-26 MED ORDER — SODIUM CHLORIDE 0.9 % IV SOLN: 250.0000 mL | INTRAVENOUS | Status: DC | PRN

## 2023-04-26 MED ORDER — SODIUM CHLORIDE 0.9 % WEIGHT BASED INFUSION
3.0000 mL/kg/h | INTRAVENOUS | Status: AC
  Administered 2023-04-26: 3 mL/kg/h via INTRAVENOUS

## 2023-04-26 MED ORDER — LIDOCAINE HCL (PF) 1 % IJ SOLN
INTRAMUSCULAR | Status: AC
  Filled 2023-04-26: qty 30

## 2023-04-26 MED ORDER — SODIUM CHLORIDE 0.9% FLUSH: 3.0000 mL | INTRAVENOUS | Status: DC | PRN

## 2023-04-26 MED ORDER — CLOPIDOGREL BISULFATE 75 MG PO TABS: 75.0000 mg | ORAL_TABLET | Freq: Every day | ORAL | Status: DC

## 2023-04-26 MED ORDER — SODIUM CHLORIDE 0.9 % WEIGHT BASED INFUSION: 1.0000 mL/kg/h | INTRAVENOUS | Status: DC

## 2023-04-26 MED ORDER — NITROGLYCERIN 1 MG/10 ML FOR IR/CATH LAB
INTRA_ARTERIAL | Status: AC
  Filled 2023-04-26: qty 10

## 2023-04-26 MED ORDER — HEPARIN SODIUM (PORCINE) 1000 UNIT/ML IJ SOLN
INTRAMUSCULAR | Status: DC | PRN
  Administered 2023-04-26 (×2): 5000 [IU] via INTRAVENOUS

## 2023-04-26 MED ORDER — SODIUM CHLORIDE 0.9% FLUSH: 3.0000 mL | Freq: Two times a day (BID) | INTRAVENOUS | Status: DC

## 2023-04-26 MED ORDER — VERAPAMIL HCL 2.5 MG/ML IV SOLN: INTRAVENOUS | Status: DC | PRN

## 2023-04-26 MED ORDER — ONDANSETRON HCL 4 MG/2ML IJ SOLN: 4.0000 mg | Freq: Four times a day (QID) | INTRAMUSCULAR | Status: DC | PRN

## 2023-04-26 MED ORDER — ASPIRIN 81 MG PO TBEC
81.0000 mg | DELAYED_RELEASE_TABLET | Freq: Every day | ORAL | 2 refills | Status: DC
  Filled 2023-04-26: qty 150, 150d supply, fill #0

## 2023-04-26 MED ORDER — FENTANYL CITRATE (PF) 100 MCG/2ML IJ SOLN
INTRAMUSCULAR | Status: DC | PRN
  Administered 2023-04-26 (×2): 25 ug via INTRAVENOUS

## 2023-04-26 MED ORDER — HEPARIN SODIUM (PORCINE) 1000 UNIT/ML IJ SOLN
INTRAMUSCULAR | Status: AC
  Filled 2023-04-26: qty 10

## 2023-04-26 MED ORDER — FAMOTIDINE IN NACL 20-0.9 MG/50ML-% IV SOLN
INTRAVENOUS | Status: AC
  Filled 2023-04-26: qty 50

## 2023-04-26 MED ORDER — FAMOTIDINE IN NACL 20-0.9 MG/50ML-% IV SOLN
INTRAVENOUS | Status: DC | PRN
  Administered 2023-04-26: 20 mg via INTRAVENOUS

## 2023-04-26 MED ORDER — IOHEXOL 350 MG/ML SOLN
INTRAVENOUS | Status: DC | PRN
  Administered 2023-04-26: 145 mL

## 2023-04-26 MED ORDER — ASPIRIN 81 MG PO CHEW: 81.0000 mg | CHEWABLE_TABLET | ORAL | Status: DC

## 2023-04-26 MED ORDER — LIDOCAINE HCL (PF) 1 % IJ SOLN
INTRAMUSCULAR | Status: DC | PRN
  Administered 2023-04-26 (×2): 5 mL

## 2023-04-26 MED ORDER — FENTANYL CITRATE (PF) 100 MCG/2ML IJ SOLN
INTRAMUSCULAR | Status: AC
  Filled 2023-04-26: qty 2

## 2023-04-26 MED ORDER — ASPIRIN 81 MG PO CHEW: 81.0000 mg | CHEWABLE_TABLET | Freq: Every day | ORAL | Status: DC

## 2023-04-26 MED ORDER — HEPARIN (PORCINE) IN NACL 1000-0.9 UT/500ML-% IV SOLN
INTRAVENOUS | Status: DC | PRN
  Administered 2023-04-26 (×2): 500 mL

## 2023-04-26 MED ORDER — ACETAMINOPHEN 325 MG PO TABS: 650.0000 mg | ORAL_TABLET | ORAL | Status: DC | PRN

## 2023-04-26 MED ORDER — CLOPIDOGREL BISULFATE 300 MG PO TABS
ORAL_TABLET | ORAL | Status: DC | PRN
  Administered 2023-04-26: 600 mg via ORAL

## 2023-04-26 MED ORDER — CLOPIDOGREL BISULFATE 300 MG PO TABS
ORAL_TABLET | ORAL | Status: AC
  Filled 2023-04-26: qty 1

## 2023-04-26 MED ORDER — VERAPAMIL HCL 2.5 MG/ML IV SOLN
INTRAVENOUS | Status: AC
  Filled 2023-04-26: qty 2

## 2023-04-26 MED ORDER — MIDAZOLAM HCL 2 MG/2ML IJ SOLN
INTRAMUSCULAR | Status: DC | PRN
  Administered 2023-04-26 (×2): 1 mg via INTRAVENOUS

## 2023-04-26 MED ORDER — NITROGLYCERIN 1 MG/10 ML FOR IR/CATH LAB
INTRA_ARTERIAL | Status: DC | PRN
  Administered 2023-04-26: 200 ug

## 2023-04-26 MED ORDER — NITROGLYCERIN 0.4 MG SL SUBL
0.4000 mg | SUBLINGUAL_TABLET | SUBLINGUAL | 3 refills | Status: DC | PRN
  Filled 2023-04-26: qty 25, 1d supply, fill #0

## 2023-04-26 MED ORDER — ROSUVASTATIN CALCIUM 20 MG PO TABS: 20.0000 mg | ORAL_TABLET | Freq: Every day | ORAL | Status: AC

## 2023-04-26 SURGICAL SUPPLY — 23 items
BALLN EMERGE MR 2.5X12 (BALLOONS) ×1
BALLN ~~LOC~~ EMERGE MR 4.0X8 (BALLOONS) ×1
BALLOON EMERGE MR 2.5X12 (BALLOONS) IMPLANT
BALLOON ~~LOC~~ EMERGE MR 4.0X8 (BALLOONS) IMPLANT
CATH 5FR JL3.5 JR4 ANG PIG MP (CATHETERS) IMPLANT
CATH BALLN WEDGE 5F 110CM (CATHETERS) IMPLANT
CATH OPTICROSS HD (CATHETERS) IMPLANT
CATH VISTA GUIDE 6FR XBLAD3.5 (CATHETERS) IMPLANT
DEVICE RAD COMP TR BAND LRG (VASCULAR PRODUCTS) IMPLANT
ELECT DEFIB PAD ADLT CADENCE (PAD) IMPLANT
GLIDESHEATH SLEND SS 6F .021 (SHEATH) IMPLANT
GUIDEWIRE INQWIRE 1.5J.035X260 (WIRE) IMPLANT
INQWIRE 1.5J .035X260CM (WIRE) ×1
KIT ENCORE 26 ADVANTAGE (KITS) IMPLANT
KIT HEART LEFT (KITS) ×1 IMPLANT
PACK CARDIAC CATHETERIZATION (CUSTOM PROCEDURE TRAY) ×1 IMPLANT
SHEATH GLIDE SLENDER 4/5FR (SHEATH) IMPLANT
SLED PULL BACK IVUS (MISCELLANEOUS) IMPLANT
STENT SYNERGY XD 3.50X12 (Permanent Stent) IMPLANT
SYNERGY XD 3.50X12 (Permanent Stent) ×1 IMPLANT
TRANSDUCER W/STOPCOCK (MISCELLANEOUS) ×1 IMPLANT
TUBING CIL FLEX 10 FLL-RA (TUBING) ×1 IMPLANT
WIRE ASAHI PROWATER 180CM (WIRE) IMPLANT

## 2023-04-26 NOTE — Interval H&P Note (Signed)
History and Physical Interval Note:  04/26/2023 7:11 AM  Travis Hamilton  has presented today for surgery, with the diagnosis of chest pain - shortness of breath.  The various methods of treatment have been discussed with the patient and family. After consideration of risks, benefits and other options for treatment, the patient has consented to  Procedure(s): RIGHT/LEFT HEART CATH AND CORONARY ANGIOGRAPHY (N/A) as a surgical intervention.  The patient's history has been reviewed, patient examined, no change in status, stable for surgery.  I have reviewed the patient's chart and labs.  Questions were answered to the patient's satisfaction.    Cath Lab Visit (complete for each Cath Lab visit)  Clinical Evaluation Leading to the Procedure:   ACS: No.  Non-ACS:    Anginal Classification: CCS III  Anti-ischemic medical therapy: No Therapy  Non-Invasive Test Results: Low-risk stress test findings: cardiac mortality <1%/year  Prior CABG: No previous CABG       Theron Arista [facility] 04/26/2023 7:11 AM

## 2023-04-26 NOTE — Discharge Summary (Signed)
Discharge Summary for Same Day PCI   Patient ID: Travis Hamilton MRN: [id number]; DOB: [birth year]  Admit date: 04/26/2023 Discharge date: 04/26/2023  Primary Care Provider: [name], MD  Primary Cardiologist: Donato Schultz, MD  Primary Electrophysiologist:  None   Discharge Diagnoses    Principal Problem:   CAD (coronary artery disease) Active Problems:   History of coronary angioplasty with insertion of stent   Hypertension   Hyperlipidemia with target LDL less than 70   Former smoker   DM2 (diabetes mellitus, type 2) (HCC)   Diagnostic Studies/Procedures   Left heart cath 04/26/23:   Suezanne Jacquet LAD to Prox LAD lesion is 60% stenosed.   A drug-eluting stent was successfully placed using a SYNERGY XD 3.50X12.   Post intervention, there is a 0% residual stenosis.   LV end diastolic pressure is normal.   Recommend uninterrupted dual antiplatelet therapy with Aspirin 81mg  daily and Clopidogrel 75mg  daily for a minimum of 6 months (stable ischemic heart disease-Class I recommendation).   Single vessel obstructive CAD involving ostial/proximal LAD. FFR 0.77 by angiographic FFR Normal LV filling pressures Normal right heart pressures Normal cardiac output Successful PCI of the ostial/proximal LAD with IVUS guidance and DES x 1   Plan: DAPT for 6 months for stable CAD. Anticipate same day DC.  _____________   History of Present Illness     Travis Hamilton is a 67 y.o. male with prior tobacco use, DM2, HTN, HLD, PAT, and moderate level of coronary artery disease in multiple vessels. CT coronary with CAD but with negative FFR. Follow up nuclear stress test with no ischemia. Unfortunately, he continued to report exertional angina.   Cardiac catheterization was arranged for further evaluation.  Hospital Course     The patient underwent cardiac cath as noted above with 60% ostial-proximal LAD with FFR 0.77 treated with DES 3.2 x 12 mm. Plan for DAPT with ASA/plavix for at least  6 months. The patient was seen by cardiac rehab while in short stay. There were no observed complications post cath. Radial cath site was re-evaluated prior to discharge and found to be stable without any complications. Instructions/precautions regarding cath site care were given prior to discharge.  Travis Hamilton was seen by Dr. Swaziland and determined stable for discharge home. Follow up with our office has been arranged. Medications are listed below. Pertinent changes include new plavix prescription and increased crestor to 40 mg.     _____________  Cath/PCI Registry Performance & Quality Measures: Aspirin prescribed? - Yes ADP Receptor Inhibitor (Plavix/Clopidogrel, Brilinta/Ticagrelor or Effient/Prasugrel) prescribed (includes medically managed patients)? - Yes High Intensity Statin (Lipitor 40-80mg  or Crestor 20-40mg ) prescribed? - Yes For EF <40%, was ACEI/ARB prescribed? - Yes For EF <40%, Aldosterone Antagonist (Spironolactone or Eplerenone) prescribed? - Not Applicable (EF >/= 40%) Cardiac Rehab Phase II ordered (Included Medically managed Patients)? - Yes  _____________   Discharge Vitals Blood pressure (!) 128/96, pulse 79, temperature 98.7 F (37.1 C), temperature source Oral, resp. rate 20, height 5\' 8"  (1.727 m), weight 99.3 kg, SpO2 95%.  Filed Weights   04/26/23 0554  Weight: 99.3 kg    Last Labs & Radiologic Studies    CBC No results for input(s): "WBC", "NEUTROABS", "HGB", "HCT", "MCV", "PLT" in the last 72 hours. Basic Metabolic Panel No results for input(s): "NA", "K", "CL", "CO2", "GLUCOSE", "BUN", "CREATININE", "CALCIUM", "MG", "PHOS" in the last 72 hours. Liver Function Tests No results for input(s): "AST", "ALT", "ALKPHOS", "BILITOT", "PROT", "ALBUMIN"  in the last 72 hours. No results for input(s): "LIPASE", "AMYLASE" in the last 72 hours. High Sensitivity Troponin:   No results for input(s): "TROPONINIHS" in the last 720 hours.  BNP Invalid input(s):  "POCBNP" D-Dimer No results for input(s): "DDIMER" in the last 72 hours. Hemoglobin A1C No results for input(s): "HGBA1C" in the last 72 hours. Fasting Lipid Panel No results for input(s): "CHOL", "HDL", "LDLCALC", "TRIG", "CHOLHDL", "LDLDIRECT" in the last 72 hours. Thyroid Function Tests No results for input(s): "TSH", "T4TOTAL", "T3FREE", "THYROIDAB" in the last 72 hours.  Invalid input(s): "FREET3" _____________  CARDIAC CATHETERIZATION  Result Date: 04/26/2023   Ost LAD to Prox LAD lesion is 60% stenosed.   A drug-eluting stent was successfully placed using a SYNERGY XD 3.50X12.   Post intervention, there is a 0% residual stenosis.   LV end diastolic pressure is normal.   Recommend uninterrupted dual antiplatelet therapy with Aspirin 81mg  daily and Clopidogrel 75mg  daily for a minimum of 6 months (stable ischemic heart disease-Class I recommendation). Single vessel obstructive CAD involving ostial/proximal LAD. FFR 0.77 by angiographic FFR Normal LV filling pressures Normal right heart pressures Normal cardiac output Successful PCI of the ostial/proximal LAD with IVUS guidance and DES x 1 Plan: DAPT for 6 months for stable CAD. Anticipate same day DC.    Disposition   Pt is being discharged home today in good condition.  Follow-up Plans & Appointments     Follow-up Information     Sharlene Dory, PA-C Follow up on 05/10/2023.   Specialty: Cardiology Why: 8:50 am Contact information: [address] [place] [address] [phone]                Discharge Instructions     AMB Referral to Cardiac Rehabilitation - Phase II   Complete by: As directed    Diagnosis: Coronary Stents   After initial evaluation and assessments completed: Virtual Based Care may be provided alone or in conjunction with Phase 2 Cardiac Rehab based on patient barriers.: Yes   Intensive Cardiac Rehabilitation (ICR) [facility] location only OR Traditional Cardiac Rehabilitation (TCR) *If  criteria for ICR are not met will enroll in TCR [facility] only): Yes        Discharge Medications   Allergies as of 04/26/2023   No Known Allergies      Medication List     STOP taking these medications    aspirin-acetaminophen-caffeine 250-250-65 MG tablet Commonly known as: EXCEDRIN MIGRAINE   ibuprofen 200 MG tablet Commonly known as: ADVIL       TAKE these medications    albuterol 108 (90 Base) MCG/ACT inhaler Commonly known as: VENTOLIN HFA Inhale 2 puffs into the lungs every 6 (six) hours as needed for shortness of breath or wheezing.   aspirin EC 81 MG tablet Take 1 tablet (81 mg total) by mouth daily. Swallow whole.   Breztri Aerosphere 160-9-4.8 MCG/ACT Aero Generic drug: Budeson-Glycopyrrol-Formoterol Inhale 2 puffs into the lungs in the morning and at bedtime.   clopidogrel 75 MG tablet Commonly known as: PLAVIX Take 1 tablet (75 mg total) by mouth daily with breakfast. Start taking on: April 27, 2023   econazole nitrate 1 % cream Apply 1-2 Applications topically 2 (two) times daily as needed (jock itch).   ezetimibe 10 MG tablet Commonly known as: ZETIA Take 1 tablet (10 mg total) by mouth daily. What changed: when to take this   famotidine 40 MG tablet Commonly known as: PEPCID Take 40 mg  by mouth every evening.   FreeStyle Libre 3 Sensor Misc USE AS DIRECTED CHANGE EVERY 14 DAYS   GLUCOSAMINE CHOND CMP TRIPLE PO Take 1 capsule by mouth in the morning.   lisinopril 40 MG tablet Commonly known as: ZESTRIL Take 40 mg by mouth every evening.   nitroGLYCERIN 0.4 MG SL tablet Commonly known as: Nitrostat Place 1 tablet (0.4 mg total) under the tongue every 5 (five) minutes as needed for chest pain.   rosuvastatin 20 MG tablet Commonly known as: CRESTOR Take 1 tablet (20 mg total) by mouth daily. What changed: when to take this   Synjardy XR 12.02-999 MG Tb24 Generic drug: Empagliflozin-metFORMIN HCl ER Take 2 tablets by mouth every  morning.   Toujeo SoloStar 300 UNIT/ML Solostar Pen Generic drug: insulin glargine (1 Unit Dial) Inject 35 Units into the skin every evening.           Allergies No Known Allergies  Outstanding Labs/Studies   none  Duration of Discharge Encounter   Greater than 30 minutes including physician time.  Signed, Roe Rutherford Faige Seely, PA 04/26/2023, 10:38 AM

## 2023-04-26 NOTE — Progress Notes (Signed)
Discussed with pt and wife stent, Plavix, restrictions, diet, exercise, NTG and CRPII. Receptive although unfortunately pt does not eat vegetables or fruit. Encouraged him to try to expand his palate. Will refer to [place] CRPII.  [id number] Ethelda Chick BS, ACSM-CEP 04/26/2023 12:16 PM

## 2023-04-26 NOTE — Discharge Instructions (Signed)

## 2023-04-28 IMAGING — DX DG CHEST 2V
2 series · 2 of 2 positions shown · non-contrast
Comparison: None.

CLINICAL DATA: Pt c/o SOB x 3 days. Hx of DM, GERD, HTN. Pt is a
former smoker.sob

EXAM:
CHEST - 2 VIEW

[w chest pa]
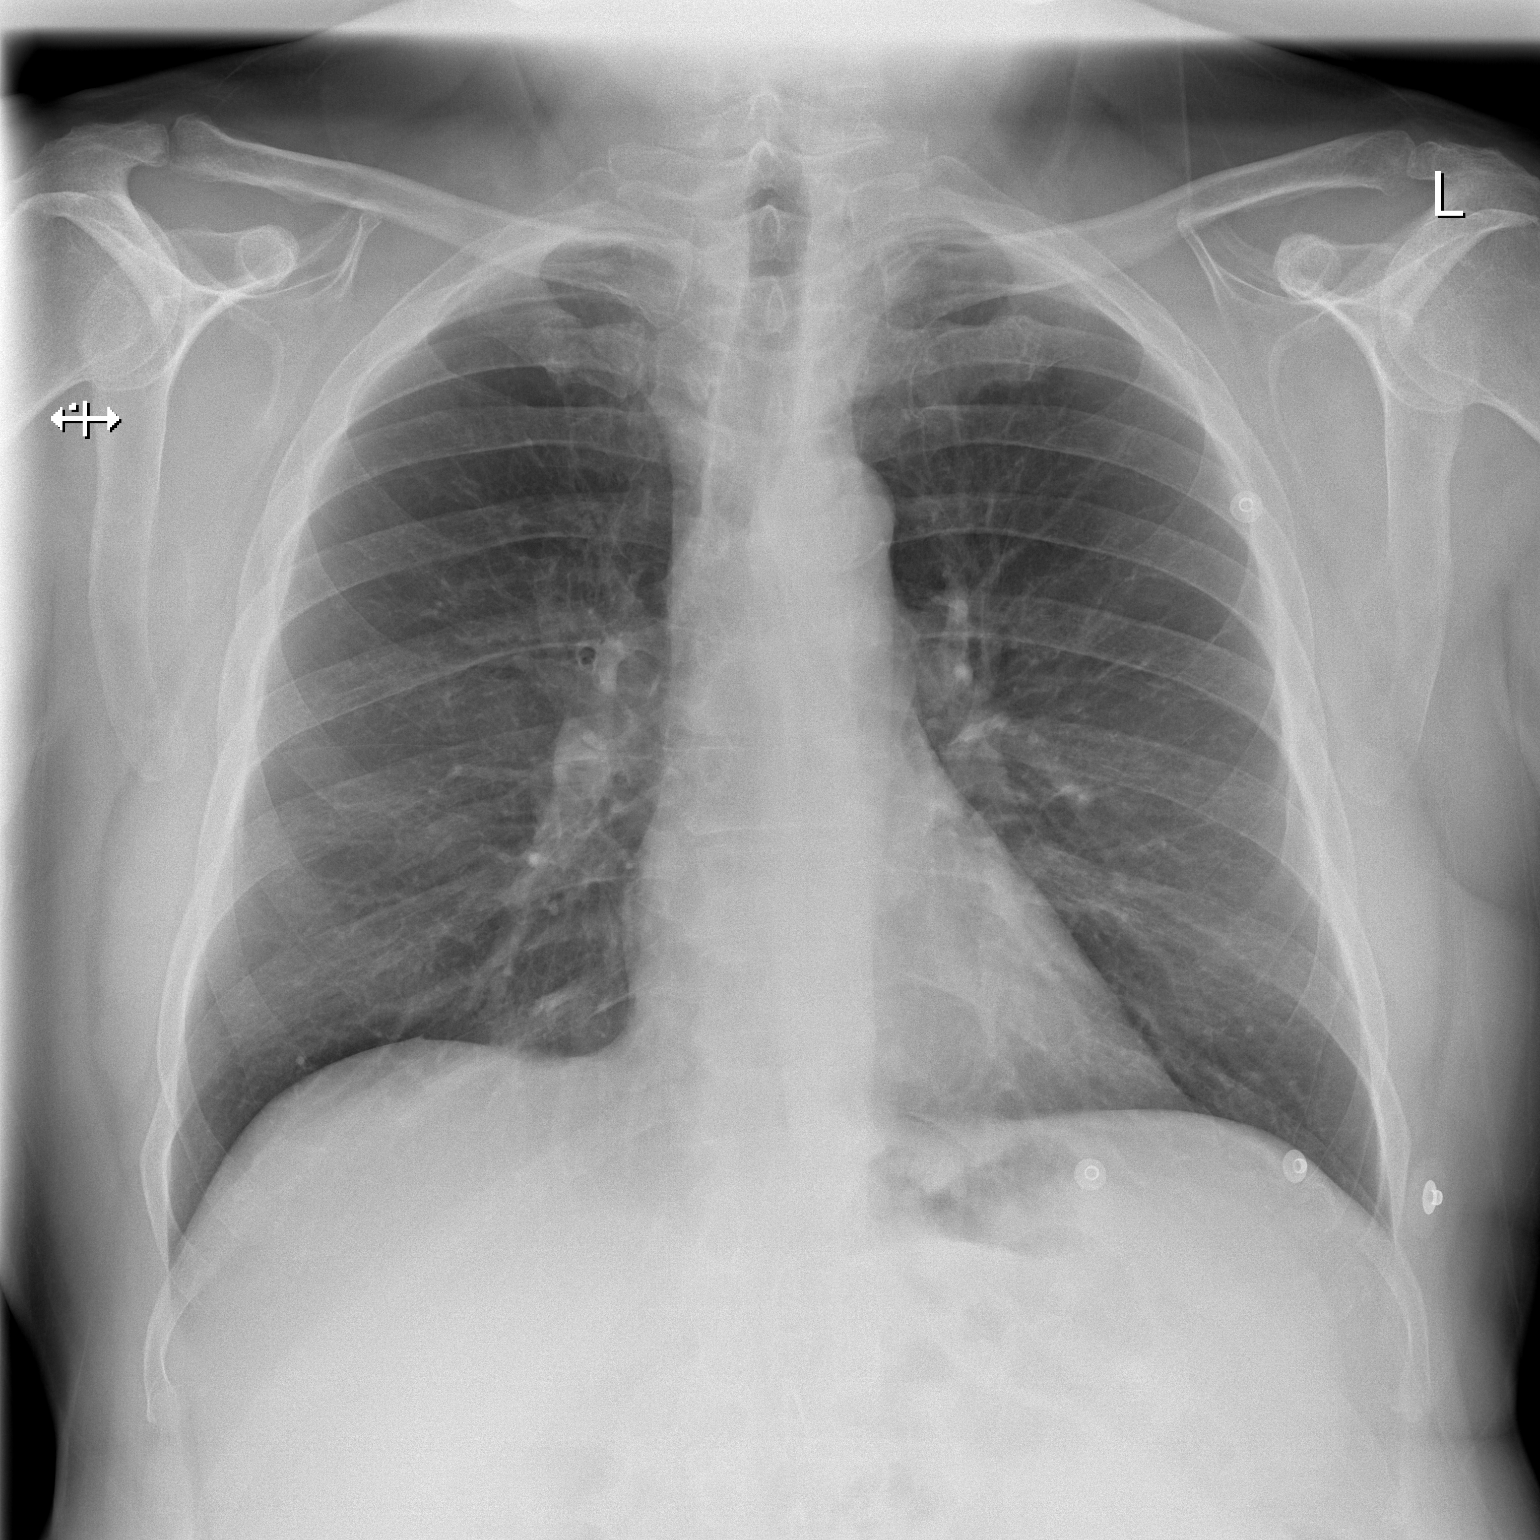

[w chest lat]
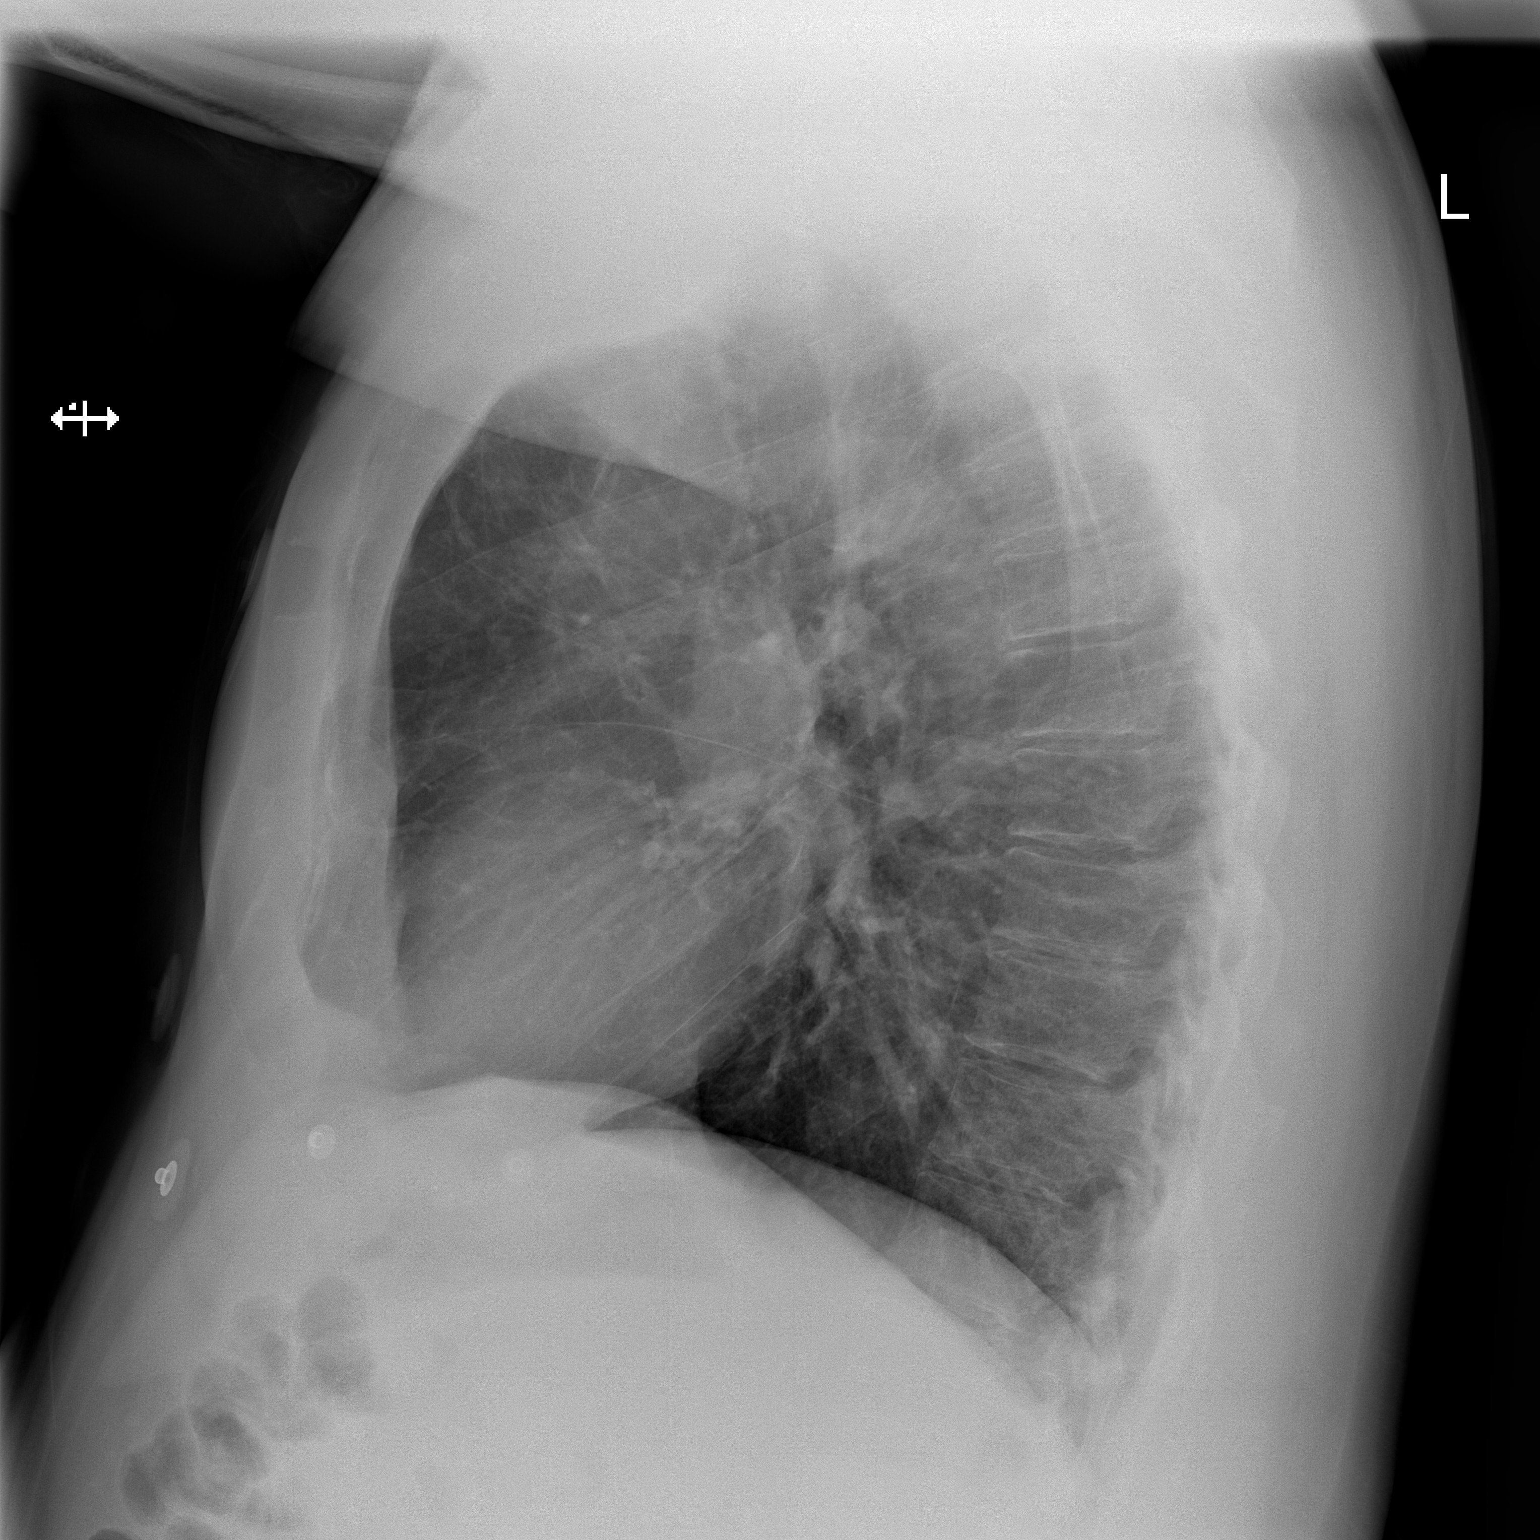

[2 of 2 positions shown; findings below may reference images not displayed]

FINDINGS: Normal mediastinum and cardiac silhouette. Normal pulmonary
vasculature. No evidence of effusion, infiltrate, or pneumothorax.
No acute bony abnormality.
IMPRESSION: No acute cardiopulmonary process.

## 2023-04-29 ENCOUNTER — Encounter (HOSPITAL_COMMUNITY): Payer: Self-pay | Admitting: Cardiology

## 2023-04-30 ENCOUNTER — Telehealth (HOSPITAL_COMMUNITY): Payer: Self-pay

## 2023-04-30 NOTE — Telephone Encounter (Signed)
Pt is unable to do the cardiac rehab program. Closed referral.

## 2023-05-10 ENCOUNTER — Ambulatory Visit: Payer: Medicare Other | Admitting: Physician Assistant

## 2023-05-20 ENCOUNTER — Ambulatory Visit: Payer: Managed Care, Other (non HMO) | Admitting: Pulmonary Disease

## 2023-05-20 ENCOUNTER — Ambulatory Visit: Payer: Medicare Other | Admitting: Pulmonary Disease

## 2023-05-20 DIAGNOSIS — R0609 Other forms of dyspnea: Secondary | ICD-10-CM | POA: Diagnosis not present

## 2023-05-20 LAB — PULMONARY FUNCTION TEST
DL/VA % pred: 88 %
DL/VA: 3.63 ml/min/mmHg/L
DLCO cor % pred: 81 %
DLCO cor: 20.17 ml/min/mmHg
DLCO unc % pred: 81 %
DLCO unc: 20.11 ml/min/mmHg
FEF 25-75 Pre: 1.64 L/sec
FEF2575-%Pred-Pre: 68 %
FEV1-%Pred-Pre: 77 %
FEV1-Pre: 2.4 L
FEV1FVC-%Pred-Pre: 89 %
FEV6-%Pred-Pre: 85 %
FEV6-Pre: 3.36 L
FEV6FVC-%Pred-Pre: 104 %
FVC-%Pred-Pre: 86 %
FVC-Pre: 3.61 L
Pre FEV1/FVC ratio: 66 %
Pre FEV6/FVC Ratio: 99 %
RV % pred: 123 %
RV: 2.85 L
TLC % pred: 98 %
TLC: 6.53 L

## 2023-05-20 NOTE — Patient Instructions (Signed)
Full PFT performed today except Post Cleda Daub due to not passing the Pre Cleda Daub.

## 2023-05-20 NOTE — Progress Notes (Signed)
Full PFT performed today except Post Cleda Daub due to not passing the Pre Cleda Daub.

## 2023-05-23 NOTE — Progress Notes (Signed)
Cardiology Office Note:  .   Date:  05/24/2023  ID:  DOHN WEIDERT, DOB 03-20-1956, MRN [id number] PCP: [name], MD  [facility] Providers Cardiologist:  Donato Schultz, MD {  History of Present Illness: .   LANDER HEMAUER is a 67 y.o. male with past medical history of tobacco use, type 2 diabetes, hypertension, hyperlipidemia, paroxysmal atrial tachycardia with moderate level of coronary artery disease in multiple vessels (proximal LAD was not hemodynamically significant on  FFR analysis stenosis however looks significant on CT).  He has continued to have shortness of breath with exertion.  Taking a shower he has to stop and rest.  Mild chest tightness.  Prior stress test and echocardiogram were unremarkable.  Unable to estimate pulmonary pressures on echo.  He is on [place].  He does follow with pulmonary and was awaiting PFTs at last appointment.  He was on Breztri and albuterol for his inhalers.  He works at [employer] and fatigues easily. Hunsucker is his doctor.   Patient had a full workup back in October including stress test, coronary CTA, and echocardiogram.  On CTA there was found to be a moderate lesion in the LAD.  However, no further workup was done at that time.  He continued to experience extreme fatigue and ultimately ended up with cardiac catheterization and stent placement of the proximal LAD.  Today, he feels so much better after the stent. 60% in the proximal LAD. He was a smoker many years but has been without for 16 years. Needs thumb surgery and colonoscopy delay to January.  Recommended delaying these 2 surgeries until after the 6 month period.  Needs to be on uninterrupted DAPT for 6 months.  Overall, feeling better.  Back to work.  Continued to recommend no heavy lifting.  Is not going to participate in cardiac rehab since it is during work hours.  Recommended continuing rehab at home and continuing daily walking to build back up muscular endurance.  No  issues with this current medication.  Blood pressure well-controlled today.  Radial site is well-healed.   Reports no shortness of breath nor dyspnea on exertion. Reports no chest pain, pressure, or tightness. No edema, orthopnea, PND. Reports no palpitations.    ROS: Pertinent ROS in HPI  Studies Reviewed: .       Cardiac catheterization 04/26/2023  Ost LAD to Prox LAD lesion is 60% stenosed.   A drug-eluting stent was successfully placed using a SYNERGY XD 3.50X12.   Post intervention, there is a 0% residual stenosis.   LV end diastolic pressure is normal.   Recommend uninterrupted dual antiplatelet therapy with Aspirin 81mg  daily and Clopidogrel 75mg  daily for a minimum of 6 months (stable ischemic heart disease-Class I recommendation).   Single vessel obstructive CAD involving ostial/proximal LAD. FFR 0.77 by angiographic FFR (Cath works) Normal LV filling pressures Normal right heart pressures Normal cardiac output Successful PCI of the ostial/proximal LAD with IVUS guidance and DES x 1   Plan: DAPT for 6 months for stable CAD. Anticipate same day DC.        Physical Exam:   VS:  BP 116/66   Pulse 77   Ht 5\' 8"  (1.727 m)   Wt 220 lb (99.8 kg)   SpO2 95%   BMI 33.45 kg/m    Wt Readings from Last 3 Encounters:  05/24/23 220 lb (99.8 kg)  04/26/23 219 lb (99.3 kg)  04/22/23 222 lb (100.7 kg)    GEN: Well  nourished, well developed in no acute distress NECK: No JVD; No carotid bruits CARDIAC: RRR, no murmurs, rubs, gallops RESPIRATORY:  Clear to auscultation without rales, wheezing or rhonchi  ABDOMEN: Soft, non-tender, non-distended EXTREMITIES:  No edema; No deformity   ASSESSMENT AND PLAN: .   1.  Coronary artery disease s/p PCI  -he feels so much better, he is doing some walking -he only took three days off of work -No significant chest pain or shortness of breath -Would continue current medication regimen including aspirin 81 mg daily, Plavix 75 mg daily (DAPT  for 6 months), Zetia 10 mg daily, lisinopril 40 mg daily, nitro as needed, Crestor 20 g daily  2.  Diabetes with primary hypertension -A1c 7.0, recommended less than 7 -working with PCP   3.  HLD   -LDL at goal, 42 -Continue Zetia and Crestor   Dispo: Plan for follow-up in 3 months with myself or Dr. Anne Fu  Signed, Sharlene Dory, PA-C

## 2023-05-24 ENCOUNTER — Encounter: Payer: Self-pay | Admitting: Physician Assistant

## 2023-05-24 ENCOUNTER — Ambulatory Visit: Payer: Managed Care, Other (non HMO) | Attending: Physician Assistant | Admitting: Physician Assistant

## 2023-05-24 VITALS — BP 116/66 | HR 77 | Ht 68.0 in | Wt 220.0 lb

## 2023-05-24 DIAGNOSIS — E785 Hyperlipidemia, unspecified: Secondary | ICD-10-CM

## 2023-05-24 DIAGNOSIS — I2510 Atherosclerotic heart disease of native coronary artery without angina pectoris: Secondary | ICD-10-CM | POA: Diagnosis not present

## 2023-05-24 DIAGNOSIS — I10 Essential (primary) hypertension: Secondary | ICD-10-CM

## 2023-05-24 DIAGNOSIS — E119 Type 2 diabetes mellitus without complications: Secondary | ICD-10-CM

## 2023-05-24 NOTE — Patient Instructions (Signed)
Medication Instructions:   Your physician recommends that you continue on your current medications as directed. Please refer to the Current Medication list given to you today.   *If you need a refill on your cardiac medications before your next appointment, please call your pharmacy*   Lab Work: NONE ORDERED  TODAY    If you have labs (blood work) drawn today and your tests are completely normal, you will receive your results only by: MyChart Message (if you have MyChart) OR A paper copy in the mail If you have any lab test that is abnormal or we need to change your treatment, we will call you to review the results.   Testing/Procedures: NONE ORDERED  TODAY     Follow-Up: At [facility], you and your health needs are our priority.  As part of our continuing mission to provide you with exceptional heart care, we have created designated Provider Care Teams.  These Care Teams include your primary Cardiologist (physician) and Advanced Practice Providers (APPs -  Physician Assistants and Nurse Practitioners) who all work together to provide you with the care you need, when you need it.  We recommend signing up for the patient portal called "MyChart".  Sign up information is provided on this After Visit Summary.  MyChart is used to connect with patients for Virtual Visits (Telemedicine).  Patients are able to view lab/test results, encounter notes, upcoming appointments, etc.  Non-urgent messages can be sent to your provider as well.   To learn more about what you can do with MyChart, go to [email or web address].    Your next appointment:   3 month(s)  Provider:   Donato Schultz, MD  /APP   Other Instructions  Low-Sodium Eating Plan Salt (sodium) helps you keep a healthy balance of fluids in your body. Too much sodium can raise your blood pressure. It can also cause fluid and waste to be held in your body. Your health care provider or dietitian may recommend a low-sodium  eating plan if you have high blood pressure (hypertension), kidney disease, liver disease, or heart failure. Eating less sodium can help lower your blood pressure and reduce swelling. It can also protect your heart, liver, and kidneys. What are tips for following this plan? Reading food labels  Check food labels for the amount of sodium per serving. If you eat more than one serving, you must multiply the listed amount by the number of servings. Choose foods with less than 140 milligrams (mg) of sodium per serving. Avoid foods with 300 mg of sodium or more per serving. Always check how much sodium is in a product, even if the label says "unsalted" or "no salt added." Shopping  Buy products labeled as "low-sodium" or "no salt added." Buy fresh foods. Avoid canned foods and pre-made or frozen meals. Avoid canned, cured, or processed meats. Buy breads that have less than 80 mg of sodium per slice. Cooking  Eat more home-cooked food. Try to eat less restaurant, buffet, and fast food. Try not to add salt when you cook. Use salt-free seasonings or herbs instead of table salt or sea salt. Check with your provider or pharmacist before using salt substitutes. Cook with plant-based oils, such as canola, sunflower, or olive oil. Meal planning When eating at a restaurant, ask if your food can be made with less salt or no salt. Avoid dishes labeled as brined, pickled, cured, or smoked. Avoid dishes made with soy sauce, miso, or teriyaki sauce. Avoid foods that have monosodium  glutamate (MSG) in them. MSG may be added to some restaurant food, sauces, soups, bouillon, and canned foods. Make meals that can be grilled, baked, poached, roasted, or steamed. These are often made with less sodium. General information Try to limit your sodium intake to 1,500-2,300 mg each day, or the amount told by your provider. What foods should I eat? Fruits Fresh, frozen, or canned fruit. Fruit juice. Vegetables Fresh or  frozen vegetables. "No salt added" canned vegetables. "No salt added" tomato sauce and paste. Low-sodium or reduced-sodium tomato and vegetable juice. Grains Low-sodium cereals, such as oats, puffed wheat and rice, and shredded wheat. Low-sodium crackers. Unsalted rice. Unsalted pasta. Low-sodium bread. Whole grain breads and whole grain pasta. Meats and other proteins Fresh or frozen meat, poultry, seafood, and fish. These should have no added salt. Low-sodium canned tuna and salmon. Unsalted nuts. Dried peas, beans, and lentils without added salt. Unsalted canned beans. Eggs. Unsalted nut butters. Dairy Milk. Soy milk. Cheese that is naturally low in sodium, such as ricotta cheese, fresh mozzarella, or Swiss cheese. Low-sodium or reduced-sodium cheese. Cream cheese. Yogurt. Seasonings and condiments Fresh and dried herbs and spices. Salt-free seasonings. Low-sodium mustard and ketchup. Sodium-free salad dressing. Sodium-free light mayonnaise. Fresh or refrigerated horseradish. Lemon juice. Vinegar. Other foods Homemade, reduced-sodium, or low-sodium soups. Unsalted popcorn and pretzels. Low-salt or salt-free chips. The items listed above may not be all the foods and drinks you can have. Talk to a dietitian to learn more. What foods should I avoid? Vegetables Sauerkraut, pickled vegetables, and relishes. Olives. [place] fries. Onion rings. Regular canned vegetables, except low-sodium or reduced-sodium items. Regular canned tomato sauce and paste. Regular tomato and vegetable juice. Frozen vegetables in sauces. Grains Instant hot cereals. Bread stuffing, pancake, and biscuit mixes. Croutons. Seasoned rice or pasta mixes. Noodle soup cups. Boxed or frozen macaroni and cheese. Regular salted crackers. Self-rising flour. Meats and other proteins Meat or fish that is salted, canned, smoked, spiced, or pickled. Precooked or cured meat, such as sausages or meat loaves. Tomasa Blase. Ham. Pepperoni. Hot dogs.  Corned beef. Chipped beef. Salt pork. Jerky. Pickled herring, anchovies, and sardines. Regular canned tuna. Salted nuts. Dairy Processed cheese and cheese spreads. Hard cheeses. Cheese curds. Blue cheese. Feta cheese. String cheese. Regular cottage cheese. Buttermilk. Canned milk. Fats and oils Salted butter. Regular margarine. Ghee. Bacon fat. Seasonings and condiments Onion salt, garlic salt, seasoned salt, table salt, and sea salt. Canned and packaged gravies. Worcestershire sauce. Tartar sauce. Barbecue sauce. Teriyaki sauce. Soy sauce, including reduced-sodium soy sauce. Steak sauce. Fish sauce. Oyster sauce. Cocktail sauce. Horseradish that you find on the shelf. Regular ketchup and mustard. Meat flavorings and tenderizers. Bouillon cubes. Hot sauce. Pre-made or packaged marinades. Pre-made or packaged taco seasonings. Relishes. Regular salad dressings. Salsa. Other foods Salted popcorn and pretzels. Corn chips and puffs. Potato and tortilla chips. Canned or dried soups. Pizza. Frozen entrees and pot pies. The items listed above may not be all the foods and drinks you should avoid. Talk to a dietitian to learn more. This information is not intended to replace advice given to you by your health care provider. Make sure you discuss any questions you have with your health care provider. Document Revised: 10/18/2022 Document Reviewed: 10/18/2022 Elsevier Patient Education  2024 Else[employer] Inc[employer]  Heart-Healthy Eating Plan Eating a healthy diet is important for the health of your heart. A heart-healthy eating plan includes: Eating less unhealthy fats. Eating more healthy fats. Eating less salt in your food. Salt  is also called sodium. Making other changes in your diet. Talk with your doctor or a diet specialist (dietitian) to create an eating plan that is right for you. What is my plan? Your doctor may recommend an eating plan that includes: Total fat: ______% or less of total calories a  day. Saturated fat: ______% or less of total calories a day. Cholesterol: less than _________mg a day. Sodium: less than _________mg a day. What are tips for following this plan? Cooking Avoid frying your food. Try to bake, boil, grill, or broil it instead. You can also reduce fat by: Removing the skin from poultry. Removing all visible fats from meats. Steaming vegetables in water or broth. Meal planning  At meals, divide your plate into four equal parts: Fill one-half of your plate with vegetables and green salads. Fill one-fourth of your plate with whole grains. Fill one-fourth of your plate with lean protein foods. Eat 2-4 cups of vegetables per day. One cup of vegetables is: 1 cup (91 g) broccoli or cauliflower florets. 2 medium carrots. 1 large bell pepper. 1 large sweet potato. 1 large tomato. 1 medium white potato. 2 cups (150 g) raw leafy greens. Eat 1-2 cups of fruit per day. One cup of fruit is: 1 small apple 1 large banana 1 cup (237 g) mixed fruit, 1 large orange,  cup (82 g) dried fruit, 1 cup (240 mL) 100% fruit juice. Eat more foods that have soluble fiber. These are apples, broccoli, carrots, beans, peas, and barley. Try to get 20-30 g of fiber per day. Eat 4-5 servings of nuts, legumes, and seeds per week: 1 serving of dried beans or legumes equals  cup (90 g) cooked. 1 serving of nuts is  oz (12 almonds, 24 pistachios, or 7 walnut halves). 1 serving of seeds equals  oz (8 g). General information Eat more home-cooked food. Eat less restaurant, buffet, and fast food. Limit or avoid alcohol. Limit foods that are high in starch and sugar. Avoid fried foods. Lose weight if you are overweight. Keep track of how much salt (sodium) you eat. This is important if you have high blood pressure. Ask your doctor to tell you more about this. Try to add vegetarian meals each week. Fats Choose healthy fats. These include olive oil and canola oil, flaxseeds,  walnuts, almonds, and seeds. Eat more omega-3 fats. These include salmon, mackerel, sardines, tuna, flaxseed oil, and ground flaxseeds. Try to eat fish at least 2 times each week. Check food labels. Avoid foods with trans fats or high amounts of saturated fat. Limit saturated fats. These are often found in animal products, such as meats, butter, and cream. These are also found in plant foods, such as palm oil, palm kernel oil, and coconut oil. Avoid foods with partially hydrogenated oils in them. These have trans fats. Examples are stick margarine, some tub margarines, cookies, crackers, and other baked goods. What foods should I eat? Fruits All fresh, canned (in natural juice), or frozen fruits. Vegetables Fresh or frozen vegetables (raw, steamed, roasted, or grilled). Green salads. Grains Most grains. Choose whole wheat and whole grains most of the time. Rice and pasta, including brown rice and pastas made with whole wheat. Meats and other proteins Lean, well-trimmed beef, veal, pork, and lamb. Chicken and [place] without skin. All fish and shellfish. Wild duck, rabbit, pheasant, and venison. Egg whites or low-cholesterol egg substitutes. Dried beans, peas, lentils, and tofu. Seeds and most nuts. Dairy Low-fat or nonfat cheeses, including ricotta and  mozzarella. Skim or 1% milk that is liquid, powdered, or evaporated. Buttermilk that is made with low-fat milk. Nonfat or low-fat yogurt. Fats and oils Non-hydrogenated (trans-free) margarines. Vegetable oils, including soybean, sesame, sunflower, olive, peanut, safflower, corn, canola, and cottonseed. Salad dressings or mayonnaise made with a vegetable oil. Beverages Mineral water. Coffee and tea. Diet carbonated beverages. Sweets and desserts Sherbet, gelatin, and fruit ice. Small amounts of dark chocolate. Limit all sweets and desserts. Seasonings and condiments All seasonings and condiments. The items listed above may not be a complete  list of foods and drinks you can eat. Contact a dietitian for more options. What foods should I avoid? Fruits Canned fruit in heavy syrup. Fruit in cream or butter sauce. Fried fruit. Limit coconut. Vegetables Vegetables cooked in cheese, cream, or butter sauce. Fried vegetables. Grains Breads that are made with saturated or trans fats, oils, or whole milk. Croissants. Sweet rolls. Donuts. High-fat crackers, such as cheese crackers. Meats and other proteins Fatty meats, such as hot dogs, ribs, sausage, bacon, rib-eye roast or steak. High-fat deli meats, such as salami and bologna. Caviar. Domestic duck and goose. Organ meats, such as liver. Dairy Cream, sour cream, cream cheese, and creamed cottage cheese. Whole-milk cheeses. Whole or 2% milk that is liquid, evaporated, or condensed. Whole buttermilk. Cream sauce or high-fat cheese sauce. Yogurt that is made from whole milk. Fats and oils Meat fat, or shortening. Cocoa butter, hydrogenated oils, palm oil, coconut oil, palm kernel oil. Solid fats and shortenings, including bacon fat, salt pork, lard, and butter. Nondairy cream substitutes. Salad dressings with cheese or sour cream. Beverages Regular sodas and juice drinks with added sugar. Sweets and desserts Frosting. Pudding. Cookies. Cakes. Pies. Milk chocolate or white chocolate. Buttered syrups. Full-fat ice cream or ice cream drinks. The items listed above may not be a complete list of foods and drinks to avoid. Contact a dietitian for more information. Summary Heart-healthy meal planning includes eating less unhealthy fats, eating more healthy fats, and making other changes in your diet. Eat a balanced diet. This includes fruits and vegetables, low-fat or nonfat dairy, lean protein, nuts and legumes, whole grains, and heart-healthy oils and fats. This information is not intended to replace advice given to you by your health care provider. Make sure you discuss any questions you have with  your health care provider. Document Revised: 11/06/2021 Document Reviewed: 11/06/2021 Elsevier Patient Education  2024 [employer].

## 2023-07-04 ENCOUNTER — Encounter: Payer: Self-pay | Admitting: Pulmonary Disease

## 2023-07-04 ENCOUNTER — Ambulatory Visit: Payer: Managed Care, Other (non HMO) | Admitting: Pulmonary Disease

## 2023-07-04 VITALS — BP 120/66 | HR 102 | Ht 68.0 in | Wt 222.2 lb

## 2023-07-04 DIAGNOSIS — F17201 Nicotine dependence, unspecified, in remission: Secondary | ICD-10-CM | POA: Diagnosis not present

## 2023-07-04 DIAGNOSIS — I2510 Atherosclerotic heart disease of native coronary artery without angina pectoris: Secondary | ICD-10-CM

## 2023-07-04 NOTE — Progress Notes (Signed)
@Patient  ID: Travis Hamilton, male    DOB: Aug 08, 1956, 67 y.o.   MRN: [id number]  Chief Complaint  Patient presents with   Follow-up    Pt is here to discuss PFT results. Pt has a PFT 05-20-2023    Referring provider: [name], MD  HPI:   67 y.o. man whom we are seeing for evaluation of dyspnea on exertion.  Multiple cardiology notes in the interim since last visit reviewed.  At last visit some concern for asthma versus smoking-related lung disease.  Placed on Breztri.  No improvement in symptoms.  Follow-up with the cardiologist.  He did have some significant stenosis on CT coronary but FFR was felt to be reassuring.  Given refractory symptoms they proceeded with left heart cath.  This revealed a moderate obstructive LAD lesion status post stenting.  Since then his symptoms are markedly improved.  No issues.  Not using albuterol.    PFTs performed in interim discussed in detail.  Unfortunate spirometry unable to be interpreted given lack of repeatability.  Lung volumes demonstrate air trapping, DLCO within normal limits.  HPI at initial visit:  Dyspnea present for some time.  Months or longer.  Mildly are gradually getting worse.  Worse on inclines or stairs or carrying things.  Sometimes okay on flat distances.  No time of day when things are better or worse.  No position to make things better or worse.  No environmental or seasonal changes that he can identify that make things better or worse.  Use Breztri for short period of time, samples.  Maybe helped a little bit.  He cannot really recall.  Did not take long.  No other alleviating or exacerbating factors.  Reviewed CT coronary 07/2022 reveals clear lungs on limited view of the lungs on my review and interpretation.  Reviewed CTA PE protocol 07/2016 that shows very mild emphysematous changes in the apices on my review and interpretation, otherwise clear.  PMH: Seasonal allergies, hyperlipidemia, diabetes Surgical history:  Hernia repair, tonsillectomy, lumbar disc surgery Family history: No significant respiratory illness of first-degree relatives Social history: Former smoker, approximate 50 pack years, quit in 2008, lives in [place] / Pulmonary Flowsheets:   ACT:      No data to display          MMRC:     No data to display          Epworth:      No data to display          Tests:   FENO:  No results found for: "NITRICOXIDE"  PFT:    Latest Ref Rng & Units 05/20/2023    2:59 PM  PFT Results  FVC-Pre L 3.61   FVC-Predicted Pre % 86   Pre FEV1/FVC % % 66   FEV1-Pre L 2.40   FEV1-Predicted Pre % 77   DLCO uncorrected ml/min/mmHg 20.11   DLCO UNC% % 81   DLCO corrected ml/min/mmHg 20.17   DLCO COR %Predicted % 81   DLVA Predicted % 88   TLC L 6.53   TLC % Predicted % 98   RV % Predicted % 123   Spirometry cannot be interpreted due to lack of quality standards.  Lung volumes consistent with air trapping, otherwise normal.  DLCO within normal limits.  WALK:      No data to display          Imaging: Personally reviewed and as per EMR and discussion  in this note No results found.  Lab Results: Personally reviewed, notably eosinophils elevated in 2023 CBC    Component Value Date/Time   WBC 7.5 04/22/2023 1553   WBC 7.3 11/29/2021 0923   RBC 5.13 04/22/2023 1553   RBC 5.37 11/29/2021 0923   HGB 13.6 04/26/2023 0801   HGB 14.5 04/22/2023 1553   HCT 40.0 04/26/2023 0801   HCT 44.3 04/22/2023 1553   PLT 224 04/22/2023 1553   MCV 86 04/22/2023 1553   MCH 28.3 04/22/2023 1553   MCH 28.9 11/29/2021 0923   MCHC 32.7 04/22/2023 1553   MCHC 32.8 11/29/2021 0923   RDW 13.2 04/22/2023 1553   LYMPHSABS 2.3 11/29/2021 0923   MONOABS 1.1 (H) 11/29/2021 0923   EOSABS 0.4 11/29/2021 0923   BASOSABS 0.1 11/29/2021 0923    BMET    Component Value Date/Time   NA 139 04/26/2023 0801   NA 141 04/22/2023 1553   K 4.5 04/26/2023 0801   CL 105  04/22/2023 1553   CO2 22 04/22/2023 1553   GLUCOSE 134 (H) 04/22/2023 1553   GLUCOSE 225 (H) 09/04/2022 1350   BUN 17 04/22/2023 1553   CREATININE 1.20 04/22/2023 1553   CALCIUM 9.7 04/22/2023 1553   GFRNONAA 60 (L) 09/04/2022 1350   GFRAA >60 11/07/2017 1200    BNP    Component Value Date/Time   BNP 18.6 11/29/2021 0923    ProBNP No results found for: "PROBNP"  Specialty Problems   None   No Known Allergies   There is no immunization history on file for this patient.  Past Medical History:  Diagnosis Date   Diabetes mellitus without complication (HCC)    GERD (gastroesophageal reflux disease)    Hernia    Hyperlipidemia    Hypertension    PAT (paroxysmal atrial tachycardia)    not had problems in over 15 yrs    Tobacco History: Social History   Tobacco Use  Smoking Status Former   Current packs/day: 0.00   Average packs/day: 1.3 packs/day for 38.0 years (47.5 ttl pk-yrs)   Types: Cigarettes   Start date: 02/12/1969   Quit date: 02/13/2007   Years since quitting: 16.3  Smokeless Tobacco Never   Counseling given: Not Answered   Continue to not smoke  Outpatient Encounter Medications as of 07/04/2023  Medication Sig   albuterol (VENTOLIN HFA) 108 (90 Base) MCG/ACT inhaler Inhale 2 puffs into the lungs every 6 (six) hours as needed for shortness of breath or wheezing.   aspirin EC 81 MG tablet Take 1 tablet (81 mg total) by mouth daily. Swallow whole.   BREZTRI AEROSPHERE 160-9-4.8 MCG/ACT AERO Inhale 2 puffs into the lungs in the morning and at bedtime.   clopidogrel (PLAVIX) 75 MG tablet Take 1 tablet (75 mg total) by mouth daily with breakfast.   Continuous Glucose Sensor (FREESTYLE LIBRE 3 SENSOR) MISC USE AS DIRECTED CHANGE EVERY 14 DAYS   diclofenac Sodium (VOLTAREN) 1 % GEL Apply 1 Application topically as needed (pain).   econazole nitrate 1 % cream Apply 1-2 Applications topically 2 (two) times daily as needed (jock itch).   ezetimibe (ZETIA) 10 MG  tablet Take 1 tablet (10 mg total) by mouth daily.   famotidine (PEPCID) 40 MG tablet Take 40 mg by mouth every evening.   insulin glargine, 1 Unit Dial, (TOUJEO SOLOSTAR) 300 UNIT/ML Solostar Pen Inject 42 Units into the skin every evening.   lisinopril (ZESTRIL) 40 MG tablet Take 40 mg by mouth every evening.  Misc Natural Products (GLUCOSAMINE CHOND CMP TRIPLE PO) Take 1 capsule by mouth in the morning.   nitroGLYCERIN (NITROSTAT) 0.4 MG SL tablet Place 1 tablet (0.4 mg total) under the tongue every 5 (five) minutes as needed for chest pain.   rosuvastatin (CRESTOR) 20 MG tablet Take 1 tablet (20 mg total) by mouth daily.   SYNJARDY XR 12.02-999 MG TB24 Take 2 tablets by mouth every morning.   No facility-administered encounter medications on file as of 07/04/2023.     Review of Systems  Review of Systems  N/a Physical Exam  BP 120/66 (BP Location: Left Arm, Cuff Size: Large)   Pulse (!) 102   Ht 5\' 8"  (1.727 m)   Wt 222 lb 3.2 oz (100.8 kg)   SpO2 95%   BMI 33.79 kg/m   Wt Readings from Last 5 Encounters:  07/04/23 222 lb 3.2 oz (100.8 kg)  05/24/23 220 lb (99.8 kg)  04/26/23 219 lb (99.3 kg)  04/22/23 222 lb (100.7 kg)  03/14/23 214 lb (97.1 kg)    BMI Readings from Last 5 Encounters:  07/04/23 33.79 kg/m  05/24/23 33.45 kg/m  04/26/23 33.30 kg/m  04/22/23 33.75 kg/m  03/14/23 32.54 kg/m     Physical Exam General: Sitting in chair, no acute distress Eyes: EOMI, no icterus Neck: Supple, no JVP Pulmonary: Clear, normal work of breathing Cardiovascular: Warm, no edema Abdomen: Nondistended, bowel sounds present MSK: No synovitis, no joint effusion Neuro: Normal gait, no weakness Psych: Normal mood, full affect   Assessment & Plan:   Dyspnea on exertion: Former smoker, close 50-pack-year, possible smoking-related lung disease.  Also suspicion for possible asthma.  No improvement with Breztri.  PFTs demonstrate some air trapping, normal DLCO.  This could  be attributed to asthma or smoking-related lung disease.  Spirometry cannot be interpreted to assess for obstruction etc.  Fortunately, symptoms all resolved with stenting of LAD lesion.  Driver symptom clearly cardiac.  Now improved.  No role for inhalers given lack of improvement.  Okay to discontinue Breztri.  Continue albuterol as needed.  Tobacco abuse in remission: Congratulated on ongoing cigarette abstinence.  Last quit smoking 16 years ago, not a candidate for lung cancer screening.   Return if symptoms worsen or fail to improve, for f/u Dr. Judeth Horn.   Karren Burly, MD 07/04/2023

## 2023-07-04 NOTE — Patient Instructions (Signed)
I am glad you are doing well  It sounds like the heart stent made things much better  Since the inhaler is not helping, no need to continue Breztri.  Okay to continue albuterol as needed.  Return to clinic as needed

## 2023-07-13 ENCOUNTER — Other Ambulatory Visit (HOSPITAL_COMMUNITY): Payer: Self-pay

## 2023-08-27 ENCOUNTER — Ambulatory Visit: Payer: Medicare Other | Admitting: Cardiology

## 2023-09-01 NOTE — Progress Notes (Unsigned)
Cardiology Office Note:  .   Date:  09/02/2023  ID:  Travis Hamilton, DOB December 28, 1955, MRN [id number] PCP: [name], MD  [facility] Providers Cardiologist:  Donato Schultz, MD {  History of Present Illness: .   Travis Hamilton is a 67 y.o. male with past medical history of tobacco use, type 2 diabetes, hypertension, hyperlipidemia, paroxysmal atrial tachycardia with moderate level of coronary artery disease in multiple vessels (proximal LAD was not hemodynamically significant on  FFR analysis stenosis however looks significant on CT).  He has continued to have shortness of breath with exertion.  Taking a shower he has to stop and rest.  Mild chest tightness.  Prior stress test and echocardiogram were unremarkable.  Unable to estimate pulmonary pressures on echo.  He is on Travis Hamilton.  He does follow with pulmonary and was awaiting PFTs at last appointment.  He was on Breztri and albuterol for his inhalers.  He works at [employer] and fatigues easily. Hunsucker is his doctor.   Patient had a full workup back in October including stress test, coronary CTA, and echocardiogram.  On CTA there was found to be a moderate lesion in the LAD.  However, no further workup was done at that time.  He continued to experience extreme fatigue and ultimately ended up with cardiac catheterization and stent placement of the proximal LAD.  He was seen by me 05/24/2023, he feels so much better after the stent. 60% in the proximal LAD. He was a smoker many years but has been without for 16 years. Needs thumb surgery and colonoscopy delay to January.  Recommended delaying these 2 surgeries until after the 6 month period.  Needs to be on uninterrupted DAPT for 6 months.  Overall, feeling better.  Back to work.  Continued to recommend no heavy lifting.  Is not going to participate in cardiac rehab since it is during work hours.  Recommended continuing rehab at home and continuing daily walking to build back up  muscular endurance.  No issues with this current medication.  Blood pressure well-controlled today.  Radial site is well-healed.  Today, he presents with a history of heart disease, presented for a follow-up visit after stent placement six months ago. The patient reported feeling well with no chest pain or shortness of breath. 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 The patient reported no issues with the current medication regimen, which includes aspirin and Plavix for heart disease, and Crestor and Zetia for cholesterol management.  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 The patient is considering surgery for this issue. The patient's diet is not ideal, with frequent consumption of fast food, including cheeseburgers. The patient also has a history of diabetes, with a last known HbA1c of 7.   Reports no shortness of breath nor dyspnea on exertion. Reports no chest pain, pressure, or tightness. No edema, orthopnea, PND. Reports no palpitations.   Discussed the use of AI scribe software for clinical note transcription with the patient, who gave verbal consent to proceed.  ROS: Pertinent ROS in HPI  Studies Reviewed: .       Cardiac catheterization 04/26/2023  Ost LAD to Prox LAD lesion is 60% stenosed.   A drug-eluting stent was successfully placed using a SYNERGY XD 3.50X12.   Post intervention, there is a 0% residual stenosis.   LV end diastolic pressure is normal.   Recommend uninterrupted  dual antiplatelet therapy with Aspirin 81mg  daily and Clopidogrel 75mg  daily for a minimum of 6 months (stable ischemic heart disease-Class I recommendation).   Single vessel obstructive CAD involving ostial/proximal LAD. FFR 0.77 by angiographic FFR (Cath works) Normal LV filling pressures Normal right heart pressures Normal cardiac output Successful PCI of the ostial/proximal  LAD with IVUS guidance and DES x 1   Plan: DAPT for 6 months for stable CAD. Anticipate same day DC.        Physical Exam:   VS:  BP 100/78   Pulse 100   Ht 5\' 8"  (1.727 m)   Wt 220 lb 3.2 oz (99.9 kg)   SpO2 96%   BMI 33.48 kg/m    Wt Readings from Last 3 Encounters:  09/02/23 220 lb 3.2 oz (99.9 kg)  07/04/23 222 lb 3.2 oz (100.8 kg)  05/24/23 220 lb (99.8 kg)    GEN: Well nourished, well developed in no acute distress NECK: No JVD; No carotid bruits CARDIAC: RRR, no murmurs, rubs, gallops RESPIRATORY:  Clear to auscultation without rales, wheezing or rhonchi  ABDOMEN: Soft, non-tender, non-distended EXTREMITIES:  No edema; No deformity   ASSESSMENT AND PLAN: .   1.  Coronary artery disease s/p PCI  -No significant chest pain or shortness of breath -Would continue current medication regimen including Plavix 75 mg daily, Zetia 10 mg daily, lisinopril 40 mg daily, nitro as needed, Crestor 20 g daily -He can stop his ASA and continue plavix alone -continue to increase exercise as able  2.  Diabetes with primary hypertension -A1c 7.0, recommended less than 7 -working with PCP   3.  HLD   -LDL at goal, 45 -Continue Zetia and Crestor   Dispo: Plan for follow-up in 6 months with myself or Dr. Anne Fu  Signed, Sharlene Dory, PA-C

## 2023-09-02 ENCOUNTER — Encounter: Payer: Self-pay | Admitting: Physician Assistant

## 2023-09-02 ENCOUNTER — Ambulatory Visit: Payer: Managed Care, Other (non HMO) | Attending: Physician Assistant | Admitting: Physician Assistant

## 2023-09-02 VITALS — BP 100/78 | HR 100 | Ht 68.0 in | Wt 220.2 lb

## 2023-09-02 DIAGNOSIS — E119 Type 2 diabetes mellitus without complications: Secondary | ICD-10-CM

## 2023-09-02 DIAGNOSIS — E7800 Pure hypercholesterolemia, unspecified: Secondary | ICD-10-CM | POA: Diagnosis not present

## 2023-09-02 DIAGNOSIS — I10 Essential (primary) hypertension: Secondary | ICD-10-CM | POA: Diagnosis not present

## 2023-09-02 DIAGNOSIS — I2510 Atherosclerotic heart disease of native coronary artery without angina pectoris: Secondary | ICD-10-CM | POA: Diagnosis not present

## 2023-09-02 NOTE — Patient Instructions (Addendum)
Medication Instructions:   DISCONTINUE Asprin  *If you need a refill on your cardiac medications before your next appointment, please call your pharmacy*   Lab Work:  None ordered.  If you have labs (blood work) drawn today and your tests are completely normal, you will receive your results only by: MyChart Message (if you have MyChart) OR A paper copy in the mail If you have any lab test that is abnormal or we need to change your treatment, we will call you to review the results.   Testing/Procedures:  None ordered.   Follow-Up: At [facility], you and your health needs are our priority.  As part of our continuing mission to provide you with exceptional heart care, we have created designated Provider Care Teams.  These Care Teams include your primary Cardiologist (physician) and Advanced Practice Providers (APPs -  Physician Assistants and Nurse Practitioners) who all work together to provide you with the care you need, when you need it.  We recommend signing up for the patient portal called "MyChart".  Sign up information is provided on this After Visit Summary.  MyChart is used to connect with patients for Virtual Visits (Telemedicine).  Patients are able to view lab/test results, encounter notes, upcoming appointments, etc.  Non-urgent messages can be sent to your provider as well.   To learn more about what you can do with MyChart, go to [email or web address].    Your next appointment:   6 month(s)  Provider:   Donato Schultz, MD     Other Instructions  Your physician wants you to follow-up in: 6 months.  You will receive a reminder letter in the mail two months in advance. If you don't receive a letter, please call our office to schedule the follow-up appointment.  Send a faxed at [phone] to attention pre-op.  DASH Eating Plan DASH stands for Dietary Approaches to Stop Hypertension. The DASH eating plan is a healthy eating plan that has been shown  to: Lower high blood pressure (hypertension). Reduce your risk for type 2 diabetes, heart disease, and stroke. Help with weight loss. What are tips for following this plan? Reading food labels Check food labels for the amount of salt (sodium) per serving. Choose foods with less than 5 percent of the Daily Value (DV) of sodium. In general, foods with less than 300 milligrams (mg) of sodium per serving fit into this eating plan. To find whole grains, look for the word "whole" as the first word in the ingredient list. Shopping Buy products labeled as "low-sodium" or "no salt added." Buy fresh foods. Avoid canned foods and pre-made or frozen meals. Cooking Try not to add salt when you cook. Use salt-free seasonings or herbs instead of table salt or sea salt. Check with your health care provider or pharmacist before using salt substitutes. Do not fry foods. Cook foods in healthy ways, such as baking, boiling, grilling, roasting, or broiling. Cook using oils that are good for your heart. These include olive, canola, avocado, soybean, and sunflower oil. Meal planning  Eat a balanced diet. This should include: 4 or more servings of fruits and 4 or more servings of vegetables each day. Try to fill half of your plate with fruits and vegetables. 6-8 servings of whole grains each day. 6 or less servings of lean meat, poultry, or fish each day. 1 oz is 1 serving. A 3 oz (85 g) serving of meat is about the same size as the palm of your hand. One egg  is 1 oz (28 g). 2-3 servings of low-fat dairy each day. One serving is 1 cup (237 mL). 1 serving of nuts, seeds, or beans 5 times each week. 2-3 servings of heart-healthy fats. Healthy fats called omega-3 fatty acids are found in foods such as walnuts, flaxseeds, fortified milks, and eggs. These fats are also found in cold-water fish, such as sardines, salmon, and mackerel. Limit how much you eat of: Canned or prepackaged foods. Food that is high in trans  fat, such as fried foods. Food that is high in saturated fat, such as fatty meat. Desserts and other sweets, sugary drinks, and other foods with added sugar. Full-fat dairy products. Do not salt foods before eating. Do not eat more than 4 egg yolks a week. Try to eat at least 2 vegetarian meals a week. Eat more home-cooked food and less restaurant, buffet, and fast food. Lifestyle When eating at a restaurant, ask if your food can be made with less salt or no salt. If you drink alcohol: Limit how much you have to: 0-1 drink a day if you are male. 0-2 drinks a day if you are male. Know how much alcohol is in your drink. In the [place] one drink is one 12 oz bottle of beer (355 mL), one 5 oz glass of wine (148 mL), or one 1 oz glass of hard liquor (44 mL). General information Avoid eating more than 2,300 mg of salt a day. If you have hypertension, you may need to reduce your sodium intake to 1,500 mg a day. Work with your provider to stay at a healthy body weight or lose weight. Ask what the best weight range is for you. On most days of the week, get at least 30 minutes of exercise that causes your heart to beat faster. This may include walking, swimming, or biking. Work with your provider or dietitian to adjust your eating plan to meet your specific calorie needs. What foods should I eat? Fruits All fresh, dried, or frozen fruit. Canned fruits that are in their natural juice and do not have sugar added to them. Vegetables Fresh or frozen vegetables that are raw, steamed, roasted, or grilled. Low-sodium or reduced-sodium tomato and vegetable juice. Low-sodium or reduced-sodium tomato sauce and tomato paste. Low-sodium or reduced-sodium canned vegetables. Grains Whole-grain or whole-wheat bread. Whole-grain or whole-wheat pasta. Brown rice. Orpah Cobb. Bulgur. Whole-grain and low-sodium cereals. Pita bread. Low-fat, low-sodium crackers. Whole-wheat flour tortillas. Meats and other  proteins Skinless chicken or [place]. Ground chicken or [place]. Pork with fat trimmed off. Fish and seafood. Egg whites. Dried beans, peas, or lentils. Unsalted nuts, nut butters, and seeds. Unsalted canned beans. Lean cuts of beef with fat trimmed off. Low-sodium, lean precooked or cured meat, such as sausages or meat loaves. Dairy Low-fat (1%) or fat-free (skim) milk. Reduced-fat, low-fat, or fat-free cheeses. Nonfat, low-sodium ricotta or cottage cheese. Low-fat or nonfat yogurt. Low-fat, low-sodium cheese. Fats and oils Soft margarine without trans fats. Vegetable oil. Reduced-fat, low-fat, or light mayonnaise and salad dressings (reduced-sodium). Canola, safflower, olive, avocado, soybean, and sunflower oils. Avocado. Seasonings and condiments Herbs. Spices. Seasoning mixes without salt. Other foods Unsalted popcorn and pretzels. Fat-free sweets. The items listed above may not be all the foods and drinks you can have. Talk to a dietitian to learn more. What foods should I avoid? Fruits Canned fruit in a light or heavy syrup. Fried fruit. Fruit in cream or butter sauce. Vegetables Creamed or fried vegetables. Vegetables in a cheese sauce.  Regular canned vegetables that are not marked as low-sodium or reduced-sodium. Regular canned tomato sauce and paste that are not marked as low-sodium or reduced-sodium. Regular tomato and vegetable juices that are not marked as low-sodium or reduced-sodium. Rosita Fire. Olives. Grains Baked goods made with fat, such as croissants, muffins, or some breads. Dry pasta or rice meal packs. Meats and other proteins Fatty cuts of meat. Ribs. Fried meat. Tomasa Blase. Bologna, salami, and other precooked or cured meats, such as sausages or meat loaves, that are not lean and low in sodium. Fat from the back of a pig (fatback). Bratwurst. Salted nuts and seeds. Canned beans with added salt. Canned or smoked fish. Whole eggs or egg yolks. Chicken or [place] with skin. Dairy Whole  or 2% milk, cream, and half-and-half. Whole or full-fat cream cheese. Whole-fat or sweetened yogurt. Full-fat cheese. Nondairy creamers. Whipped toppings. Processed cheese and cheese spreads. Fats and oils Butter. Stick margarine. Lard. Shortening. Ghee. Bacon fat. Tropical oils, such as coconut, palm kernel, or palm oil. Seasonings and condiments Onion salt, garlic salt, seasoned salt, table salt, and sea salt. Worcestershire sauce. Tartar sauce. Barbecue sauce. Teriyaki sauce. Soy sauce, including reduced-sodium soy sauce. Steak sauce. Canned and packaged gravies. Fish sauce. Oyster sauce. Cocktail sauce. Store-bought horseradish. Ketchup. Mustard. Meat flavorings and tenderizers. Bouillon cubes. Hot sauces. Pre-made or packaged marinades. Pre-made or packaged taco seasonings. Relishes. Regular salad dressings. Other foods Salted popcorn and pretzels. The items listed above may not be all the foods and drinks you should avoid. Talk to a dietitian to learn more. Where to find more information National Heart, Lung, and Blood Institute (NHLBI): BuffaloDryCleaner.gl American Heart Association (AHA): heart.org Academy of Nutrition and Dietetics: eatright.org National Kidney Foundation (NKF): kidney.org This information is not intended to replace advice given to you by your health care provider. Make sure you discuss any questions you have with your health care provider. Document Revised: 10/18/2022 Document Reviewed: 10/18/2022 Elsevier Patient Education  2024 Elsevier Inc. Adopting a Healthy Lifestyle.   Weight: Know what a healthy weight is for you (roughly BMI <25) and aim to maintain this. You can calculate your body mass index on your smart phone. Unfortunately, this is not the most accurate measure of healthy weight, but it is the simplest measurement to use. A more accurate measurement involves body scanning which measures lean muscle, fat tissue and bony density. We do not have this equipment at  [facility].    Diet: Aim for 7+ servings of fruits and vegetables daily Limit animal fats in diet for cholesterol and heart health - choose grass fed whenever available Avoid highly processed foods (fast food burgers, tacos, fried chicken, pizza, hot dogs, french fries)  Saturated fat comes in the form of butter, lard, coconut oil, margarine, partially hydrogenated oils, and fat in meat. These increase your risk of cardiovascular disease.  Use healthy plant oils, such as olive, canola, soy, corn, sunflower and peanut.  Whole foods such as fruits, vegetables and whole grains have fiber  Men need > 38 grams of fiber per day Women need > 25 grams of fiber per day  Load up on vegetables and fruits - one-half of your plate: Aim for color and variety, and remember that potatoes dont count. Go for whole grains - one-quarter of your plate: Whole wheat, barley, wheat berries, quinoa, oats, brown rice, and foods made with them. If you want pasta, go with whole wheat pasta. Protein power - one-quarter of your plate: Fish, chicken, beans, and nuts  are all healthy, versatile protein sources. Limit red meat. You need carbohydrates for energy! The type of carbohydrate is more important than the amount. Choose carbohydrates such as vegetables, fruits, whole grains, beans, and nuts in the place of white rice, white pasta, potatoes (baked or fried), macaroni and cheese, cakes, cookies, and donuts.  If youre thirsty, drink water. Coffee and tea are good in moderation, but skip sugary drinks and limit milk and dairy products to one or two daily servings. Keep sugar intake at 6 teaspoons or 24 grams or LESS       Exercise: Aim for 150 min of moderate intensity exercise weekly for heart health, and weights twice weekly for bone health Stay active - any steps are better than no steps! Aim for 7-9 hours of sleep daily

## 2024-04-13 ENCOUNTER — Encounter: Payer: Self-pay | Admitting: Cardiology

## 2024-04-13 ENCOUNTER — Ambulatory Visit: Attending: Cardiology | Admitting: Cardiology

## 2024-04-13 VITALS — BP 124/77 | HR 92 | Resp 16 | Ht 68.0 in | Wt 216.2 lb

## 2024-04-13 DIAGNOSIS — I2510 Atherosclerotic heart disease of native coronary artery without angina pectoris: Secondary | ICD-10-CM

## 2024-04-13 DIAGNOSIS — I10 Essential (primary) hypertension: Secondary | ICD-10-CM

## 2024-04-13 DIAGNOSIS — Z955 Presence of coronary angioplasty implant and graft: Secondary | ICD-10-CM | POA: Diagnosis not present

## 2024-04-13 DIAGNOSIS — E7800 Pure hypercholesterolemia, unspecified: Secondary | ICD-10-CM | POA: Diagnosis not present

## 2024-04-13 MED ORDER — PANTOPRAZOLE SODIUM 40 MG PO TBEC: 40.0000 mg | DELAYED_RELEASE_TABLET | Freq: Every day | ORAL | 1 refills | Status: DC

## 2024-04-13 NOTE — Patient Instructions (Addendum)
 Medication Instructions:  Please take Pantoprazole 40 mg once a day. Continue all other medications as listed.  *If you need a refill on your cardiac medications before your next appointment, please call your pharmacy*  Follow-Up: At [facility], you and your health needs are our priority.  As part of our continuing mission to provide you with exceptional heart care, our providers are all part of one team.  This team includes your primary Cardiologist (physician) and Advanced Practice Providers or APPs (Physician Assistants and Nurse Practitioners) who all work together to provide you with the care you need, when you need it.  Your next appointment:   1 year(s)  Provider:   Oneil Parchment, MD    We recommend signing up for the patient portal called MyChart.  Sign up information is provided on this After Visit Summary.  MyChart is used to connect with patients for Virtual Visits (Telemedicine).  Patients are able to view lab/test results, encounter notes, upcoming appointments, etc.  Non-urgent messages can be sent to your provider as well.   To learn more about what you can do with MyChart, go to [email or web address].

## 2024-04-14 NOTE — Progress Notes (Signed)
 Cardiology Office Note:  .   Date:  04/14/2024  ID:  Travis Hamilton, DOB 1956-07-10, MRN [id number] PCP: Waylan Almarie SAUNDERS, MD  [facility] Providers Cardiologist:  Oneil Parchment, MD    History of Present Illness: .   Travis Hamilton is a 68 y.o. male Discussed the use of AI scribe software for clinical note transcription with the patient, who gave verbal consent to proceed.  History of Present Illness Travis Hamilton is a 68 year old male with coronary artery disease who presents for a follow-up visit.  He has a history of multivessel coronary artery disease and underwent a cardiac catheterization on April 26, 2023, which revealed single vessel obstructive coronary artery disease involving the proximal LAD. He subsequently had a successful PCI with stent placement and is currently on Plavix  monotherapy.  He experiences increased phlegm in his windpipe, leading to frequent coughing throughout the day and night. No associated nasal symptoms or allergies. He has a history of acid reflux, which was previously more frequent but has improved since starting Zetia . He is currently taking famotidine  for stomach acid issues. He has a history of using lisinopril, which was discontinued due to a cough. He is also on montelukast, prescribed by a pulmonologist to help with respiratory symptoms.  He discusses the financial burden of his medications, noting that his insurance coverage changed in February, leading to increased costs for his diabetes management supplies, such as the [employer] and [employer].  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  Works for [employer].  Brother-in-law had CABG.  Decree stamina.    ROS: no syncope, easy bruising  Studies Reviewed: SABRA   EKG Interpretation Date/Time:  Monday April 13 2024 15:36:25 EDT Ventricular Rate:  91 PR Interval:  130 QRS Duration:  94 QT Interval:  382 QTC  Calculation: 469 R Axis:   16  Text Interpretation: Normal sinus rhythm Possible inferior infarct When compared with ECG of 26-Apr-2023 09:10, Non-specific change in ST segment in Inferior leads Non-specific change in ST segment in Lateral leads Confirmed by Parchment Oneil ([id number]) on 04/13/2024 4:12:55 PM    Results LABS A1c: 7 LDL: 45  DIAGNOSTIC Cardiac catheterization: Single vessel obstructive CAD involving the proximal LAD; successful PCI (04/26/2023) FFR analysis: Proximal LAD not significant Risk Assessment/Calculations:            Physical Exam:   VS:  BP 124/77 (BP Location: Left Arm, Patient Position: Sitting, Cuff Size: Large)   Pulse 92   Resp 16   Ht 5' 8 (1.727 m)   Wt 216 lb 3.2 oz (98.1 kg)   SpO2 93%   BMI 32.87 kg/m    Wt Readings from Last 3 Encounters:  04/13/24 216 lb 3.2 oz (98.1 kg)  09/02/23 220 lb 3.2 oz (99.9 kg)  07/04/23 222 lb 3.2 oz (100.8 kg)    GEN: Well nourished, well developed in no acute distress NECK: No JVD; No carotid bruits CARDIAC: RRR, no murmurs, no rubs, no gallops RESPIRATORY:  Clear to auscultation without rales, wheezing or rhonchi  ABDOMEN: Soft, non-tender, non-distended EXTREMITIES:  No edema; No deformity   ASSESSMENT AND PLAN: .    Assessment and Plan Assessment & Plan Coronary artery disease Multivessel coronary artery disease with single vessel obstructive CAD involving the proximal LAD, treated with PCI. Currently on Plavix  monotherapy. Continued Plavix  is recommended despite minor bleeding issues due to its  benefits in stable coronary disease. - Continue Plavix  monotherapy - Discontinue aspirin   Hyperlipidemia LDL is well-controlled at 39 with Crestor  and Zetia . - Continue Crestor  20 mg - Continue Zetia  10 mg  Gastroesophageal reflux disease (GERD) Intermittent acid reflux symptoms, possibly contributing to phlegm and cough. Symptoms improved with famotidine  but may benefit from pantoprazole. - Consider trial  of pantoprazole 40 mg for acid reflux - Discuss with primary care physician for potential long-term management  Carpal tunnel syndrome Carpal tunnel syndrome with associated joint pain and torn ligament, impacting ability to play guitar.         Dispo: 1 yr  Signed, Oneil Parchment, MD

## 2024-05-08 ENCOUNTER — Other Ambulatory Visit: Payer: Self-pay

## 2024-05-08 MED ORDER — CLOPIDOGREL BISULFATE 75 MG PO TABS: 75.0000 mg | ORAL_TABLET | Freq: Every day | ORAL | 3 refills | Status: AC

## 2024-06-29 ENCOUNTER — Ambulatory Visit (INDEPENDENT_AMBULATORY_CARE_PROVIDER_SITE_OTHER): Admitting: Pulmonary Disease

## 2024-06-29 ENCOUNTER — Encounter: Payer: Self-pay | Admitting: Pulmonary Disease

## 2024-06-29 VITALS — BP 120/80 | HR 93 | Temp 98.2°F | Ht 68.0 in | Wt 216.0 lb

## 2024-06-29 DIAGNOSIS — Z87891 Personal history of nicotine dependence: Secondary | ICD-10-CM

## 2024-06-29 DIAGNOSIS — R053 Chronic cough: Secondary | ICD-10-CM | POA: Diagnosis not present

## 2024-06-29 MED ORDER — TRELEGY ELLIPTA 200-62.5-25 MCG/ACT IN AEPB: 1.0000 | INHALATION_SPRAY | Freq: Every day | RESPIRATORY_TRACT | 11 refills | Status: AC

## 2024-06-29 MED ORDER — AZITHROMYCIN 250 MG PO TABS: ORAL_TABLET | ORAL | 0 refills | Status: AC

## 2024-06-29 NOTE — Progress Notes (Signed)
 @Patient  ID: Travis Hamilton, male    DOB: 16-Jun-1956, 68 y.o.   MRN: [id number]  Chief Complaint  Patient presents with   Cough    Patient states the coughing is coming from trachea not lung. Symptoms is getting worse.   Shortness of Breath    Breathing is better    Referring provider: Waylan Almarie SAUNDERS, MD  HPI:   68 y.o. man whom we are seeing for evaluation of chronic cough.  Multiple cardiology notes in the interim since last visit reviewed.  Previous seen for dyspnea exertion.  All symptoms resolved with LAD stent in the past.  He complains of cough now.  Present for a few months.  Unclear trigger.  Although he does mention a viral illness back in the winter.  Although he does not think this triggered it.  Productive of mucus.  Feels like congestion in the center of his chest.  Not deep, but in the upper chest.  Also has some nasal congestion but not a lot.  Endorses heartburn issues but well-controlled with H2 blocker per report.  He had a chest x-ray in the midst of the symptoms 03/2024 via [facility], results reviewed, clear lungs reported.  I cannot review the images.  HPI at initial visit:  Dyspnea present for some time.  Months or longer.  Mildly are gradually getting worse.  Worse on inclines or stairs or carrying things.  Sometimes okay on flat distances.  No time of day when things are better or worse.  No position to make things better or worse.  No environmental or seasonal changes that he can identify that make things better or worse.  Use Breztri  for short period of time, samples.  Maybe helped a little bit.  He cannot really recall.  Did not take long.  No other alleviating or exacerbating factors.  Reviewed CT coronary 07/2022 reveals clear lungs on limited view of the lungs on my review and interpretation.  Reviewed CTA PE protocol 07/2016 that shows very mild emphysematous changes in the apices on my review and interpretation, otherwise clear.  PMH: Seasonal  allergies, hyperlipidemia, diabetes Surgical history: Hernia repair, tonsillectomy, lumbar disc surgery Family history: No significant respiratory illness of first-degree relatives Social history: Former smoker, approximate 50 pack years, quit in 2008, lives in [place] / Pulmonary Flowsheets:   ACT:      No data to display          MMRC:     No data to display          Epworth:      No data to display          Tests:   FENO:  No results found for: NITRICOXIDE  PFT:    Latest Ref Rng & Units 05/20/2023    2:59 PM  PFT Results  FVC-Pre L 3.61   FVC-Predicted Pre % 86   Pre FEV1/FVC % % 66   FEV1-Pre L 2.40   FEV1-Predicted Pre % 77   DLCO uncorrected ml/min/mmHg 20.11   DLCO UNC% % 81   DLCO corrected ml/min/mmHg 20.17   DLCO COR %Predicted % 81   DLVA Predicted % 88   TLC L 6.53   TLC % Predicted % 98   RV % Predicted % 123   Spirometry cannot be interpreted due to lack of quality standards.  Lung volumes consistent with air trapping, otherwise normal.  DLCO within normal limits.  WALK:  No data to display          Imaging: Personally reviewed and as per EMR and discussion in this note No results found.  Lab Results: Personally reviewed, notably eosinophils elevated in 2023 CBC    Component Value Date/Time   WBC 7.5 04/22/2023 1553   WBC 7.3 11/29/2021 0923   RBC 5.13 04/22/2023 1553   RBC 5.37 11/29/2021 0923   HGB 13.6 04/26/2023 0801   HGB 14.5 04/22/2023 1553   HCT 40.0 04/26/2023 0801   HCT 44.3 04/22/2023 1553   PLT 224 04/22/2023 1553   MCV 86 04/22/2023 1553   MCH 28.3 04/22/2023 1553   MCH 28.9 11/29/2021 0923   MCHC 32.7 04/22/2023 1553   MCHC 32.8 11/29/2021 0923   RDW 13.2 04/22/2023 1553   LYMPHSABS 2.3 11/29/2021 0923   MONOABS 1.1 (H) 11/29/2021 0923   EOSABS 0.4 11/29/2021 0923   BASOSABS 0.1 11/29/2021 0923    BMET    Component Value Date/Time   NA 139 04/26/2023 0801   NA 141  04/22/2023 1553   K 4.5 04/26/2023 0801   CL 105 04/22/2023 1553   CO2 22 04/22/2023 1553   GLUCOSE 134 (H) 04/22/2023 1553   GLUCOSE 225 (H) 09/04/2022 1350   BUN 17 04/22/2023 1553   CREATININE 1.20 04/22/2023 1553   CALCIUM  9.7 04/22/2023 1553   GFRNONAA 60 (L) 09/04/2022 1350   GFRAA >60 11/07/2017 1200    BNP    Component Value Date/Time   BNP 18.6 11/29/2021 0923    ProBNP No results found for: PROBNP  Specialty Problems   None   Allergies  Allergen Reactions   Atenolol     Other Reaction(s): sexual side effects     There is no immunization history on file for this patient.  Past Medical History:  Diagnosis Date   Diabetes mellitus without complication (HCC)    GERD (gastroesophageal reflux disease)    Hernia    Hyperlipidemia    Hypertension    PAT (paroxysmal atrial tachycardia) (HCC)    not had problems in over 15 yrs    Tobacco History: Social History   Tobacco Use  Smoking Status Former   Current packs/day: 0.00   Average packs/day: 1.3 packs/day for 38.0 years (47.5 ttl pk-yrs)   Types: Cigarettes   Start date: 02/12/1969   Quit date: 02/13/2007   Years since quitting: 17.3  Smokeless Tobacco Never   Counseling given: Not Answered   Continue to not smoke  Outpatient Encounter Medications as of 06/29/2024  Medication Sig   azithromycin  (ZITHROMAX ) 250 MG tablet Take 2 tablets (500 mg total) by mouth daily for 1 day, THEN 1 tablet (250 mg total) daily for 4 days.   clopidogrel  (PLAVIX ) 75 MG tablet Take 1 tablet (75 mg total) by mouth daily with breakfast.   Continuous Glucose Sensor (FREESTYLE LIBRE 3 SENSOR) MISC USE AS DIRECTED CHANGE EVERY 14 DAYS   econazole nitrate 1 % cream Apply 1-2 Applications topically 2 (two) times daily as needed (jock itch).   ezetimibe  (ZETIA ) 10 MG tablet Take 1 tablet (10 mg total) by mouth daily.   famotidine  (PEPCID ) 40 MG tablet Take 40 mg by mouth every evening.   Fluticasone-Umeclidin-Vilant (TRELEGY  ELLIPTA) 200-62.5-25 MCG/ACT AEPB Inhale 1 puff into the lungs daily.   hydrochlorothiazide (HYDRODIURIL) 25 MG tablet Take 25 mg by mouth every morning.   insulin glargine, 1 Unit Dial, (TOUJEO SOLOSTAR) 300 UNIT/ML Solostar Pen Inject 42 Units into the skin  every evening.   Misc Natural Products (GLUCOSAMINE CHOND CMP TRIPLE PO) Take 1 capsule by mouth in the morning.   montelukast (SINGULAIR) 10 MG tablet Take 10 mg by mouth daily.   rosuvastatin  (CRESTOR ) 20 MG tablet Take 1 tablet (20 mg total) by mouth daily.   RYBELSUS 7 MG TABS Take 1 tablet by mouth daily.   SYNJARDY XR 12.02-999 MG TB24 Take 2 tablets by mouth every morning.   [DISCONTINUED] pantoprazole  (PROTONIX ) 40 MG tablet Take 1 tablet (40 mg total) by mouth daily.   [DISCONTINUED] TRELEGY ELLIPTA  100-62.5-25 MCG/ACT AEPB 1 puff.   nitroGLYCERIN  (NITROSTAT ) 0.4 MG SL tablet Place 1 tablet (0.4 mg total) under the tongue every 5 (five) minutes as needed for chest pain.   No facility-administered encounter medications on file as of 06/29/2024.     Review of Systems  Review of Systems  N/a Physical Exam  BP 120/80 (BP Location: Left Arm, Patient Position: Sitting)   Pulse 93   Temp 98.2 F (36.8 C) (Oral)   Ht 5' 8 (1.727 m)   Wt 216 lb (98 kg)   SpO2 96%   BMI 32.84 kg/m   Wt Readings from Last 5 Encounters:  06/29/24 216 lb (98 kg)  04/13/24 216 lb 3.2 oz (98.1 kg)  09/02/23 220 lb 3.2 oz (99.9 kg)  07/04/23 222 lb 3.2 oz (100.8 kg)  05/24/23 220 lb (99.8 kg)    BMI Readings from Last 5 Encounters:  06/29/24 32.84 kg/m  04/13/24 32.87 kg/m  09/02/23 33.48 kg/m  07/04/23 33.79 kg/m  05/24/23 33.45 kg/m     Physical Exam General: Sitting in chair, no acute distress Eyes: EOMI, no icterus Neck: Supple, no JVP Pulmonary: Clear, normal work of breathing Cardiovascular: Warm, no edema Abdomen: Nondistended, bowel sounds present MSK: No synovitis, no joint effusion Neuro: Normal gait, no  weakness Psych: Normal mood, full affect   Assessment & Plan:   Chronic cough: Productive of sputum.  Chest imaging, chest x-ray clear.  High suspicion for underlying small airways disease.  Escalate Trelegy from low-dose to high-dose.  Z-Pak prescribed today.  Differential diagnosis includes UA CS given postnasal drip and reflux.  He also has a lot of nasal congestion and ear congestion which brings her begs the question of postnasal drip.  Ear congestion, eustachian tube dysfunction likely present.  Consider round of prednisone but he just completed some and we would like to avoid elevation of blood glucose if able.  Tobacco abuse in remission: Last quit smoking 17 years ago, not a candidate for lung cancer screening.   Return in about 2 months (around 08/29/2024) for f/u Dr. Annella.   Travis JONELLE Annella, MD 06/29/2024

## 2024-06-29 NOTE — Patient Instructions (Signed)
 I increased Trelegy to high dose  1 puff once a day  Take zpak as prescribed   Return to clinic in 2 months or sooner as needed

## 2024-07-06 ENCOUNTER — Other Ambulatory Visit: Payer: Self-pay

## 2024-07-07 MED ORDER — NITROGLYCERIN 0.4 MG SL SUBL: 0.4000 mg | SUBLINGUAL_TABLET | SUBLINGUAL | 3 refills | Status: AC | PRN

## 2024-09-02 ENCOUNTER — Ambulatory Visit: Admitting: Pulmonary Disease

## 2024-09-02 ENCOUNTER — Encounter: Payer: Self-pay | Admitting: Pulmonary Disease

## 2024-09-02 VITALS — BP 119/76 | HR 80 | Temp 98.2°F | Ht 68.0 in | Wt 212.0 lb

## 2024-09-02 DIAGNOSIS — R053 Chronic cough: Secondary | ICD-10-CM

## 2024-09-02 DIAGNOSIS — R0609 Other forms of dyspnea: Secondary | ICD-10-CM

## 2024-09-02 LAB — CBC WITH DIFFERENTIAL/PLATELET
Basophils Absolute: 0 K/uL (ref 0.0–0.1)
Basophils Relative: 0.6 % (ref 0.0–3.0)
Eosinophils Absolute: 0.3 K/uL (ref 0.0–0.7)
Eosinophils Relative: 5.7 % — ABNORMAL HIGH (ref 0.0–5.0)
HCT: 42.1 % (ref 39.0–52.0)
Hemoglobin: 14.4 g/dL (ref 13.0–17.0)
Lymphocytes Relative: 32.9 % (ref 12.0–46.0)
Lymphs Abs: 1.9 K/uL (ref 0.7–4.0)
MCHC: 34.2 g/dL (ref 30.0–36.0)
MCV: 83.3 fl (ref 78.0–100.0)
Monocytes Absolute: 0.6 K/uL (ref 0.1–1.0)
Monocytes Relative: 9.7 % (ref 3.0–12.0)
Neutro Abs: 2.9 K/uL (ref 1.4–7.7)
Neutrophils Relative %: 51.1 % (ref 43.0–77.0)
Platelets: 185 K/uL (ref 150.0–400.0)
RBC: 5.06 Mil/uL (ref 4.22–5.81)
RDW: 13.6 % (ref 11.5–15.5)
WBC: 5.7 K/uL (ref 4.0–10.5)

## 2024-09-02 MED ORDER — PREDNISONE 20 MG PO TABS
ORAL_TABLET | ORAL | 0 refills | Status: AC
Start: 2024-09-02 — End: 2024-09-12

## 2024-09-02 NOTE — Patient Instructions (Signed)
 Nice to see you again  Continue Trelegy  Prednisone taper today to see if this helps illuminate the cough  CT scan for further evaluation  Blood work today to evaluate for candidacy for additional medications, injection medicines for presumed cough related to asthma  Return to clinic in 2 months or sooner as needed with Dr. Annella

## 2024-09-02 NOTE — Progress Notes (Signed)
 @Patient  ID: Travis Hamilton, male    DOB: June 01, 1956, 68 y.o.   MRN: [id number]  Chief Complaint  Patient presents with   Cough    Phlegm is still the same as last visit.    Referring provider: Waylan Almarie SAUNDERS, MD  HPI:   68 y.o. man whom we are seeing for evaluation of chronic cough.  ENT note reviewed.  Cough prednisone spring 2025.  New symptom.  Evaluated 06/2024.  Escalated inhaler regimen to high-dose Trelegy.  Chest congestion, mucus production.  Denies postnasal drip runny nose etc.  No improvement.  ENT evaluated by did not really address cough at all.  There is no fiberoptic exam.  We discussed role and rationale for treatment of prolonged inflammation with prednisone.  Short course.  Additional testing, lab work for phenotyping for presumed asthma.  Additional imaging with ongoing cough.  HPI at initial visit:  Dyspnea present for some time.  Months or longer.  Mildly are gradually getting worse.  Worse on inclines or stairs or carrying things.  Sometimes okay on flat distances.  No time of day when things are better or worse.  No position to make things better or worse.  No environmental or seasonal changes that he can identify that make things better or worse.  Use Breztri  for short period of time, samples.  Maybe helped a little bit.  He cannot really recall.  Did not take long.  No other alleviating or exacerbating factors.  Reviewed CT coronary 07/2022 reveals clear lungs on limited view of the lungs on my review and interpretation.  Reviewed CTA PE protocol 07/2016 that shows very mild emphysematous changes in the apices on my review and interpretation, otherwise clear.  PMH: Seasonal allergies, hyperlipidemia, diabetes Surgical history: Hernia repair, tonsillectomy, lumbar disc surgery Family history: No significant respiratory illness of first-degree relatives Social history: Former smoker, approximate 50 pack years, quit in 2008, lives in  [place] / Pulmonary Flowsheets:   ACT:      No data to display          MMRC:     No data to display          Epworth:      No data to display          Tests:   FENO:  No results found for: NITRICOXIDE  PFT:    Latest Ref Rng & Units 05/20/2023    2:59 PM  PFT Results  FVC-Pre L 3.61   FVC-Predicted Pre % 86   Pre FEV1/FVC % % 66   FEV1-Pre L 2.40   FEV1-Predicted Pre % 77   DLCO uncorrected ml/min/mmHg 20.11   DLCO UNC% % 81   DLCO corrected ml/min/mmHg 20.17   DLCO COR %Predicted % 81   DLVA Predicted % 88   TLC L 6.53   TLC % Predicted % 98   RV % Predicted % 123   Spirometry cannot be interpreted due to lack of quality standards.  Lung volumes consistent with air trapping, otherwise normal.  DLCO within normal limits.  WALK:      No data to display          Imaging: Personally reviewed and as per EMR and discussion in this note No results found.  Lab Results: Personally reviewed, notably eosinophils elevated in 2023 CBC    Component Value Date/Time   WBC 7.5 04/22/2023 1553   WBC 7.3 11/29/2021 0923   RBC 5.13 04/22/2023  1553   RBC 5.37 11/29/2021 0923   HGB 13.6 04/26/2023 0801   HGB 14.5 04/22/2023 1553   HCT 40.0 04/26/2023 0801   HCT 44.3 04/22/2023 1553   PLT 224 04/22/2023 1553   MCV 86 04/22/2023 1553   MCH 28.3 04/22/2023 1553   MCH 28.9 11/29/2021 0923   MCHC 32.7 04/22/2023 1553   MCHC 32.8 11/29/2021 0923   RDW 13.2 04/22/2023 1553   LYMPHSABS 2.3 11/29/2021 0923   MONOABS 1.1 (H) 11/29/2021 0923   EOSABS 0.4 11/29/2021 0923   BASOSABS 0.1 11/29/2021 0923    BMET    Component Value Date/Time   NA 139 04/26/2023 0801   NA 141 04/22/2023 1553   K 4.5 04/26/2023 0801   CL 105 04/22/2023 1553   CO2 22 04/22/2023 1553   GLUCOSE 134 (H) 04/22/2023 1553   GLUCOSE 225 (H) 09/04/2022 1350   BUN 17 04/22/2023 1553   CREATININE 1.20 04/22/2023 1553   CALCIUM  9.7 04/22/2023 1553   GFRNONAA  60 (L) 09/04/2022 1350   GFRAA >60 11/07/2017 1200    BNP    Component Value Date/Time   BNP 18.6 11/29/2021 0923    ProBNP No results found for: PROBNP  Specialty Problems   None   Allergies  Allergen Reactions   Atenolol     Other Reaction(s): sexual side effects     There is no immunization history on file for this patient.  Past Medical History:  Diagnosis Date   Diabetes mellitus without complication (HCC)    GERD (gastroesophageal reflux disease)    Hernia    Hyperlipidemia    Hypertension    PAT (paroxysmal atrial tachycardia)    not had problems in over 15 yrs    Tobacco History: Social History   Tobacco Use  Smoking Status Former   Current packs/day: 0.00   Average packs/day: 1.3 packs/day for 38.0 years (47.5 ttl pk-yrs)   Types: Cigarettes   Start date: 02/12/1969   Quit date: 02/13/2007   Years since quitting: 17.5  Smokeless Tobacco Never   Counseling given: Not Answered   Continue to not smoke  Outpatient Encounter Medications as of 09/02/2024  Medication Sig   clopidogrel  (PLAVIX ) 75 MG tablet Take 1 tablet (75 mg total) by mouth daily with breakfast.   Continuous Glucose Sensor (FREESTYLE LIBRE 3 SENSOR) MISC USE AS DIRECTED CHANGE EVERY 14 DAYS   econazole nitrate 1 % cream Apply 1-2 Applications topically 2 (two) times daily as needed (jock itch).   ezetimibe  (ZETIA ) 10 MG tablet Take 1 tablet (10 mg total) by mouth daily.   famotidine  (PEPCID ) 40 MG tablet Take 40 mg by mouth every evening.   Fluticasone-Umeclidin-Vilant (TRELEGY ELLIPTA ) 200-62.5-25 MCG/ACT AEPB Inhale 1 puff into the lungs daily.   hydrochlorothiazide (HYDRODIURIL) 25 MG tablet Take 25 mg by mouth every morning.   insulin glargine, 1 Unit Dial, (TOUJEO SOLOSTAR) 300 UNIT/ML Solostar Pen Inject 42 Units into the skin every evening.   Misc Natural Products (GLUCOSAMINE CHOND CMP TRIPLE PO) Take 1 capsule by mouth in the morning.   montelukast (SINGULAIR) 10 MG tablet  Take 10 mg by mouth daily.   nitroGLYCERIN  (NITROSTAT ) 0.4 MG SL tablet Place 1 tablet (0.4 mg total) under the tongue every 5 (five) minutes as needed for chest pain.   predniSONE  (DELTASONE ) 20 MG tablet Take 2 tablets (40 mg total) by mouth daily with breakfast for 5 days, THEN 1 tablet (20 mg total) daily with breakfast for 5 days.  rosuvastatin  (CRESTOR ) 20 MG tablet Take 1 tablet (20 mg total) by mouth daily.   RYBELSUS 7 MG TABS Take 1 tablet by mouth daily. (Patient taking differently: Take 1 tablet by mouth daily. Taking 14 mg)   SYNJARDY XR 12.02-999 MG TB24 Take 2 tablets by mouth every morning.   pantoprazole  (PROTONIX ) 40 MG tablet Take 40 mg by mouth daily. (Patient not taking: Reported on 09/02/2024)   No facility-administered encounter medications on file as of 09/02/2024.     Review of Systems  Review of Systems  N/a Physical Exam  BP 119/76   Pulse 80   Temp 98.2 F (36.8 C) (Oral)   Ht 5' 8 (1.727 m)   Wt 212 lb (96.2 kg)   SpO2 94%   BMI 32.23 kg/m   Wt Readings from Last 5 Encounters:  09/02/24 212 lb (96.2 kg)  06/29/24 216 lb (98 kg)  04/13/24 216 lb 3.2 oz (98.1 kg)  09/02/23 220 lb 3.2 oz (99.9 kg)  07/04/23 222 lb 3.2 oz (100.8 kg)    BMI Readings from Last 5 Encounters:  09/02/24 32.23 kg/m  06/29/24 32.84 kg/m  04/13/24 32.87 kg/m  09/02/23 33.48 kg/m  07/04/23 33.79 kg/m     Physical Exam General: Sitting up in no distress Eyes: No icterus Neck: No JVP Pulmonary: Clear, normal work of breathing Cardiovascular: Warm Abdomen: not distended  Assessment & Plan:   Chronic cough due to presumed cough variant asthma: Productive of sputum.  Chest imaging, chest x-ray clear.  High suspicion for underlying small airways disease.  No real improvement with increase to high-dose Trelegy.  Given clear chest x-ray, CT scan ordered for further evaluation given history of smoking.  Does not appear today.  Labs today for phenotyping for  consideration of biologic therapy.  Tobacco abuse in remission: Last quit smoking 17 years ago, not a candidate for lung cancer screening.  Dyspnea on exertion: Mild.  No real improvement Trelegy.  Assess candidacy for Biologics as above.   Return in about 2 months (around 11/02/2024) for f/u Dr. Annella.   Donnice JONELLE Annella, MD 09/02/2024

## 2024-09-03 LAB — IGE: IgE (Immunoglobulin E), Serum: 55 kU/L (ref <=114)

## 2024-09-05 LAB — ALLERGEN PROFILE, PERENNIAL ALLERGEN IGE

## 2024-09-07 ENCOUNTER — Other Ambulatory Visit

## 2024-09-22 ENCOUNTER — Ambulatory Visit
Admission: RE | Admit: 2024-09-22 | Discharge: 2024-09-22 | Disposition: A | Source: Ambulatory Visit | Attending: Pulmonary Disease | Admitting: Pulmonary Disease

## 2024-09-22 DIAGNOSIS — R053 Chronic cough: Secondary | ICD-10-CM

## 2024-10-02 ENCOUNTER — Ambulatory Visit: Payer: Self-pay | Admitting: Pulmonary Disease

## 2024-10-02 DIAGNOSIS — R911 Solitary pulmonary nodule: Secondary | ICD-10-CM

## 2024-10-10 ENCOUNTER — Other Ambulatory Visit: Payer: Self-pay | Admitting: Cardiology

## 2024-11-04 ENCOUNTER — Encounter: Payer: Self-pay | Admitting: Pulmonary Disease

## 2024-11-04 ENCOUNTER — Ambulatory Visit (INDEPENDENT_AMBULATORY_CARE_PROVIDER_SITE_OTHER): Admitting: Pulmonary Disease

## 2024-11-04 VITALS — BP 140/82 | HR 77 | Temp 98.0°F | Ht 68.5 in | Wt 219.0 lb

## 2024-11-04 DIAGNOSIS — Z87891 Personal history of nicotine dependence: Secondary | ICD-10-CM | POA: Diagnosis not present

## 2024-11-04 DIAGNOSIS — R911 Solitary pulmonary nodule: Secondary | ICD-10-CM

## 2024-11-04 DIAGNOSIS — R053 Chronic cough: Secondary | ICD-10-CM | POA: Diagnosis not present

## 2024-11-04 DIAGNOSIS — J4550 Severe persistent asthma, uncomplicated: Secondary | ICD-10-CM

## 2024-11-04 NOTE — Patient Instructions (Addendum)
 Nice to see you again  Continue Trelegy  With ongoing phlegm production cough, I think the next best treatment options are biologic injections.  Based on the lab work we got recently, I think a medicine like Nucala is the best choice.  This is injected once every 4 weeks.  We will fill out paperwork today and do a benefits investigation.  Our pharmacy team will be in contact regarding this and to schedule the injection assuming that it is cost effective.  This injection after the first 1 can then be administered at home.  Return to clinic in 3 months, no need to schedule today with your work schedule, we will place a reminder and hopefully someone will call you this time to schedule the appointment.  This will also give us  opportunity to follow-up on that CT scan to be performed in March, again I think this is an area of scarring but just want to keep an eye on it.

## 2024-11-04 NOTE — Progress Notes (Signed)
 "  @Patient  ID: Travis Hamilton, male    DOB: March 24, 1956, 69 y.o.   MRN: [id number]  Chief Complaint  Patient presents with   Cough    Pt states since LOV breathing has been about the same Pt stated phlegm still feels like its in his throat sometimes he is able to cough it up     Referring provider: Waylan Almarie SAUNDERS, MD  HPI:   68 y.o. man whom we are seeing for evaluation of chronic cough.    Ongoing cough, phlegm production.  Really unchanged.  Trelegy does seem to help but still persistent.  We discussed recent labs, eosinophils of 300.  Good target for biologic therapy is related to cough variant asthma.  We discussed if cost effective would be reasonable before.  Side effects risk and benefits discussed.  Agreed to move forward assuming cost is not too bad.  HPI at initial visit:  Dyspnea present for some time.  Months or longer.  Mildly are gradually getting worse.  Worse on inclines or stairs or carrying things.  Sometimes okay on flat distances.  No time of day when things are better or worse.  No position to make things better or worse.  No environmental or seasonal changes that he can identify that make things better or worse.  Use Breztri  for short period of time, samples.  Maybe helped a little bit.  He cannot really recall.  Did not take long.  No other alleviating or exacerbating factors.  Reviewed CT coronary 07/2022 reveals clear lungs on limited view of the lungs on my review and interpretation.  Reviewed CTA PE protocol 07/2016 that shows very mild emphysematous changes in the apices on my review and interpretation, otherwise clear.  PMH: Seasonal allergies, hyperlipidemia, diabetes Surgical history: Hernia repair, tonsillectomy, lumbar disc surgery Family history: No significant respiratory illness of first-degree relatives Social history: Former smoker, approximate 50 pack years, quit in 2008, lives in [place] / Pulmonary Flowsheets:   ACT:       No data to display          MMRC:     No data to display          Epworth:      No data to display          Tests:   FENO:  No results found for: NITRICOXIDE  PFT:    Latest Ref Rng & Units 05/20/2023    2:59 PM  PFT Results  FVC-Pre L 3.61   FVC-Predicted Pre % 86   Pre FEV1/FVC % % 66   FEV1-Pre L 2.40   FEV1-Predicted Pre % 77   DLCO uncorrected ml/min/mmHg 20.11   DLCO UNC% % 81   DLCO corrected ml/min/mmHg 20.17   DLCO COR %Predicted % 81   DLVA Predicted % 88   TLC L 6.53   TLC % Predicted % 98   RV % Predicted % 123   Spirometry cannot be interpreted due to lack of quality standards.  Lung volumes consistent with air trapping, otherwise normal.  DLCO within normal limits.  WALK:      No data to display          Imaging: Personally reviewed and as per EMR and discussion in this note No results found.  Lab Results: Personally reviewed, notably eosinophils elevated in 2023 CBC    Component Value Date/Time   WBC 5.7 09/02/2024 1041   RBC 5.06 09/02/2024 1041  HGB 14.4 09/02/2024 1041   HGB 14.5 04/22/2023 1553   HCT 42.1 09/02/2024 1041   HCT 44.3 04/22/2023 1553   PLT 185.0 09/02/2024 1041   PLT 224 04/22/2023 1553   MCV 83.3 09/02/2024 1041   MCV 86 04/22/2023 1553   MCH 28.3 04/22/2023 1553   MCH 28.9 11/29/2021 0923   MCHC 34.2 09/02/2024 1041   RDW 13.6 09/02/2024 1041   RDW 13.2 04/22/2023 1553   LYMPHSABS 1.9 09/02/2024 1041   MONOABS 0.6 09/02/2024 1041   EOSABS 0.3 09/02/2024 1041   BASOSABS 0.0 09/02/2024 1041    BMET    Component Value Date/Time   NA 139 04/26/2023 0801   NA 141 04/22/2023 1553   K 4.5 04/26/2023 0801   CL 105 04/22/2023 1553   CO2 22 04/22/2023 1553   GLUCOSE 134 (H) 04/22/2023 1553   GLUCOSE 225 (H) 09/04/2022 1350   BUN 17 04/22/2023 1553   CREATININE 1.20 04/22/2023 1553   CALCIUM  9.7 04/22/2023 1553   GFRNONAA 60 (L) 09/04/2022 1350   GFRAA >60 11/07/2017 1200    BNP     Component Value Date/Time   BNP 18.6 11/29/2021 0923    ProBNP No results found for: PROBNP  Specialty Problems   None   Allergies  Allergen Reactions   Atenolol     Other Reaction(s): sexual side effects    Immunization History  Administered Date(s) Administered   Fluad Quad(high Dose 65+) 07/14/2024    Past Medical History:  Diagnosis Date   Diabetes mellitus without complication (HCC) 2014   GERD (gastroesophageal reflux disease)    Hernia    Hyperlipidemia 2014   Hypertension 2012   PAT (paroxysmal atrial tachycardia)    not had problems in over 15 yrs    Tobacco History: Social History   Tobacco Use  Smoking Status Former   Current packs/day: 0.00   Average packs/day: 1.3 packs/day for 38.0 years (47.5 ttl pk-yrs)   Types: Cigarettes   Start date: 02/12/1969   Quit date: 02/13/2007   Years since quitting: 17.7  Smokeless Tobacco Never   Counseling given: Not Answered   Continue to not smoke  Outpatient Encounter Medications as of 11/04/2024  Medication Sig   clopidogrel  (PLAVIX ) 75 MG tablet Take 1 tablet (75 mg total) by mouth daily with breakfast.   Continuous Glucose Sensor (FREESTYLE LIBRE 3 SENSOR) MISC USE AS DIRECTED CHANGE EVERY 14 DAYS   econazole nitrate 1 % cream Apply 1-2 Applications topically 2 (two) times daily as needed (jock itch).   ezetimibe  (ZETIA ) 10 MG tablet Take 1 tablet (10 mg total) by mouth daily.   famotidine  (PEPCID ) 40 MG tablet Take 40 mg by mouth every evening.   Fluticasone-Umeclidin-Vilant (TRELEGY ELLIPTA ) 200-62.5-25 MCG/ACT AEPB Inhale 1 puff into the lungs daily.   hydrochlorothiazide (HYDRODIURIL) 25 MG tablet Take 25 mg by mouth every morning.   insulin glargine, 1 Unit Dial, (TOUJEO SOLOSTAR) 300 UNIT/ML Solostar Pen Inject 42 Units into the skin every evening.   montelukast (SINGULAIR) 10 MG tablet Take 10 mg by mouth daily.   nitroGLYCERIN  (NITROSTAT ) 0.4 MG SL tablet Place 1 tablet (0.4 mg total) under the  tongue every 5 (five) minutes as needed for chest pain.   rosuvastatin  (CRESTOR ) 20 MG tablet Take 1 tablet (20 mg total) by mouth daily.   RYBELSUS 7 MG TABS Take 1 tablet by mouth daily. (Patient taking differently: Take 1 tablet by mouth daily. Taking 14 mg)   SYNJARDY XR 12.02-999  MG TB24 Take 2 tablets by mouth every morning.   Misc Natural Products (GLUCOSAMINE CHOND CMP TRIPLE PO) Take 1 capsule by mouth in the morning. (Patient not taking: Reported on 11/04/2024)   No facility-administered encounter medications on file as of 11/04/2024.     Review of Systems  Review of Systems  N/a Physical Exam  BP (!) 140/82   Pulse 77   Temp 98 F (36.7 C) (Oral)   Ht 5' 8.5 (1.74 m) Comment: per patient  Wt 219 lb (99.3 kg)   SpO2 96% Comment: on RA  BMI 32.81 kg/m   Wt Readings from Last 5 Encounters:  11/04/24 219 lb (99.3 kg)  09/02/24 212 lb (96.2 kg)  06/29/24 216 lb (98 kg)  04/13/24 216 lb 3.2 oz (98.1 kg)  09/02/23 220 lb 3.2 oz (99.9 kg)    BMI Readings from Last 5 Encounters:  11/04/24 32.81 kg/m  09/02/24 32.23 kg/m  06/29/24 32.84 kg/m  04/13/24 32.87 kg/m  09/02/23 33.48 kg/m     Physical Exam General: In chair no distress Eyes: No icterus Neck: No JVP Pulmonary: Clear bilaterally, with initial deep breaths he clears his throat with some congestion, normal work of breathing  Assessment & Plan:   Chronic cough due to presumed cough variant asthma: Productive of sputum.  Chest imaging, chest x-ray clear.  High suspicion for underlying small airways disease.  No real improvement with increase to high-dose Trelegy.  Chest x-ray clear.  CT scan with emphysema, linear scar in the left, no obvious culprit for cough.  Continue Trelegy with some improvement.  Paperwork today for Nucala with elevated eosinophils, 300, on recent labs.  Lung nodule: In an area of linear scar, tethering, most likely old scar from some insult.  Repeat CT scan 9-month interval per  Fleischner criteria ordered and scheduled for March 2026.   Return in about 3 months (around 02/02/2025) for f/u Dr. Annella.   Donnice JONELLE Annella, MD 11/04/2024    "

## 2024-11-05 ENCOUNTER — Telehealth: Payer: Self-pay

## 2024-11-05 NOTE — Telephone Encounter (Signed)
 Received referral for new start Nucala. Opening benefits investigation in this thread.

## 2024-11-13 NOTE — Telephone Encounter (Signed)
 Submitted a Prior Authorization request to [facility] for NUCALA via CoverMyMeds. Will update once we receive a response.  Key: [id number]

## 2024-12-24 ENCOUNTER — Other Ambulatory Visit
